# Patient Record
Sex: Female | Born: 1985 | Race: Black or African American | Hispanic: No | Marital: Single | Smoking: Never smoker
Health system: Southern US, Community
[De-identification: ages and names within clinical notes are randomized; demographics above are authoritative.]

## PROBLEM LIST (undated history)

## (undated) ENCOUNTER — Inpatient Hospital Stay (HOSPITAL_COMMUNITY): Payer: Self-pay

## (undated) DIAGNOSIS — O139 Gestational [pregnancy-induced] hypertension without significant proteinuria, unspecified trimester: Secondary | ICD-10-CM

## (undated) DIAGNOSIS — N83209 Unspecified ovarian cyst, unspecified side: Secondary | ICD-10-CM

## (undated) DIAGNOSIS — A749 Chlamydial infection, unspecified: Secondary | ICD-10-CM

## (undated) HISTORY — PX: INDUCED ABORTION: SHX677

## (undated) HISTORY — PX: NO PAST SURGERIES: SHX2092

## (undated) HISTORY — DX: Unspecified ovarian cyst, unspecified side: N83.209

---

## 2005-04-26 ENCOUNTER — Emergency Department (HOSPITAL_COMMUNITY): Admission: EM | Admit: 2005-04-26 | Discharge: 2005-04-26 | Payer: Self-pay | Admitting: Emergency Medicine

## 2006-03-14 ENCOUNTER — Emergency Department (HOSPITAL_COMMUNITY): Admission: EM | Admit: 2006-03-14 | Discharge: 2006-03-15 | Payer: Self-pay | Admitting: Emergency Medicine

## 2006-07-28 ENCOUNTER — Emergency Department (HOSPITAL_COMMUNITY): Admission: EM | Admit: 2006-07-28 | Discharge: 2006-07-28 | Payer: Self-pay | Admitting: Emergency Medicine

## 2007-03-06 ENCOUNTER — Emergency Department (HOSPITAL_COMMUNITY): Admission: EM | Admit: 2007-03-06 | Discharge: 2007-03-06 | Payer: Self-pay | Admitting: Emergency Medicine

## 2009-11-06 ENCOUNTER — Emergency Department (HOSPITAL_COMMUNITY): Admission: EM | Admit: 2009-11-06 | Discharge: 2009-11-06 | Payer: Self-pay | Admitting: Emergency Medicine

## 2011-02-27 LAB — URINALYSIS, ROUTINE W REFLEX MICROSCOPIC
Bilirubin Urine: NEGATIVE
Hgb urine dipstick: NEGATIVE
Ketones, ur: 15 — AB
Protein, ur: NEGATIVE
Specific Gravity, Urine: 1.027

## 2011-02-27 LAB — DIFFERENTIAL
Basophils Absolute: 0.4 — ABNORMAL HIGH
Basophils Relative: 5 — ABNORMAL HIGH
Eosinophils Absolute: 0.1
Eosinophils Relative: 1

## 2011-02-27 LAB — WET PREP, GENITAL
Clue Cells Wet Prep HPF POC: NONE SEEN
WBC, Wet Prep HPF POC: NONE SEEN

## 2011-02-27 LAB — URINE MICROSCOPIC-ADD ON

## 2011-02-27 LAB — CBC
MCHC: 31.5
MCV: 67.2 — ABNORMAL LOW
RDW: 15.2 — ABNORMAL HIGH
WBC: 7.2

## 2011-02-27 LAB — GC/CHLAMYDIA PROBE AMP, GENITAL: Chlamydia, DNA Probe: NEGATIVE

## 2012-07-07 ENCOUNTER — Emergency Department (HOSPITAL_COMMUNITY)
Admission: EM | Admit: 2012-07-07 | Discharge: 2012-07-07 | Disposition: A | Discharge status: Home / Self Care | Payer: BLUE CROSS/BLUE SHIELD | Attending: Emergency Medicine | Admitting: Emergency Medicine

## 2012-07-07 ENCOUNTER — Emergency Department (HOSPITAL_COMMUNITY): Payer: BLUE CROSS/BLUE SHIELD

## 2012-07-07 ENCOUNTER — Encounter (HOSPITAL_COMMUNITY): Payer: Self-pay | Admitting: *Deleted

## 2012-07-07 DIAGNOSIS — Z3202 Encounter for pregnancy test, result negative: Secondary | ICD-10-CM | POA: Insufficient documentation

## 2012-07-07 DIAGNOSIS — N390 Urinary tract infection, site not specified: Secondary | ICD-10-CM

## 2012-07-07 DIAGNOSIS — N83209 Unspecified ovarian cyst, unspecified side: Secondary | ICD-10-CM | POA: Insufficient documentation

## 2012-07-07 LAB — COMPREHENSIVE METABOLIC PANEL
AST: 15 U/L (ref 0–37)
Albumin: 4 g/dL (ref 3.5–5.2)
Calcium: 9.3 mg/dL (ref 8.4–10.5)
Chloride: 104 mEq/L (ref 96–112)
Creatinine, Ser: 0.76 mg/dL (ref 0.50–1.10)

## 2012-07-07 LAB — CBC WITH DIFFERENTIAL/PLATELET
Basophils Absolute: 0.1 10*3/uL (ref 0.0–0.1)
Eosinophils Relative: 1 % (ref 0–5)
MCH: 21.7 pg — ABNORMAL LOW (ref 26.0–34.0)
MCHC: 32.9 g/dL (ref 30.0–36.0)
Monocytes Relative: 7 % (ref 3–12)
Platelets: 201 10*3/uL (ref 150–400)
RDW: 15.4 % (ref 11.5–15.5)
WBC: 6.3 10*3/uL (ref 4.0–10.5)

## 2012-07-07 LAB — URINE MICROSCOPIC-ADD ON

## 2012-07-07 LAB — URINALYSIS, ROUTINE W REFLEX MICROSCOPIC
Bilirubin Urine: NEGATIVE
Glucose, UA: NEGATIVE mg/dL
Protein, ur: NEGATIVE mg/dL
Specific Gravity, Urine: 1.023 (ref 1.005–1.030)
Urobilinogen, UA: 1 mg/dL (ref 0.0–1.0)
pH: 6 (ref 5.0–8.0)

## 2012-07-07 MED ORDER — TRAMADOL HCL 50 MG PO TABS: 50.0000 mg | ORAL_TABLET | Freq: Four times a day (QID) | ORAL | Status: DC | PRN

## 2012-07-07 MED ORDER — CEPHALEXIN 500 MG PO CAPS: 500.0000 mg | ORAL_CAPSULE | Freq: Four times a day (QID) | ORAL | Status: DC

## 2012-07-07 NOTE — ED Provider Notes (Signed)
History     CSN: [id number]  Arrival date & time 07/07/12  1138   First MD Initiated Contact with Patient 07/07/12 1320      Chief Complaint  Patient presents with  . Abdominal Pain    (Consider location/radiation/quality/duration/timing/severity/associated sxs/prior treatment) Patient is a 27 y.o. female presenting with abdominal pain. The history is provided by the patient (the pt complains of lower abd pain). No language interpreter was used.  Abdominal Pain Pain location:  RLQ Pain quality: aching   Pain radiates to:  Does not radiate Pain severity:  Moderate Onset quality:  Gradual Timing:  Constant Progression:  Waxing and waning Chronicity:  New Context: not alcohol use   Associated symptoms: no chest pain, no cough, no diarrhea, no fatigue and no hematuria     History reviewed. No pertinent past medical history.  History reviewed. No pertinent past surgical history.  No family history on file.  History  Substance Use Topics  . Smoking status: Never Smoker   . Smokeless tobacco: Not on file  . Alcohol Use: Yes     Comment: mosly wine on weekends    OB History   Grav Para Term Preterm Abortions TAB SAB Ect Mult Living                  Review of Systems  Constitutional: Negative for fatigue.  HENT: Negative for congestion, sinus pressure and ear discharge.   Eyes: Negative for discharge.  Respiratory: Negative for cough.   Cardiovascular: Negative for chest pain.  Gastrointestinal: Positive for abdominal pain. Negative for diarrhea.  Genitourinary: Negative for frequency and hematuria.  Musculoskeletal: Negative for back pain.  Skin: Negative for rash.  Neurological: Negative for seizures and headaches.  Psychiatric/Behavioral: Negative for hallucinations.    Allergies  Review of patient's allergies indicates no known allergies.  Home Medications   Current Outpatient Rx  Name  Route  Sig  Dispense  Refill  . ibuprofen (ADVIL,MOTRIN) 200 MG  tablet   Oral   Take 400-800 mg by mouth every 6 (six) hours as needed for pain.         . cephALEXin (KEFLEX) 500 MG capsule   Oral   Take 1 capsule (500 mg total) by mouth 4 (four) times daily.   28 capsule   0   . traMADol (ULTRAM) 50 MG tablet   Oral   Take 1 tablet (50 mg total) by mouth every 6 (six) hours as needed for pain.   30 tablet   0     BP 100/68  Pulse 74  Temp(Src) 99.8 F (37.7 C) (Oral)  Resp 14  SpO2 100%  LMP 06/09/2012  Physical Exam  Constitutional: She is oriented to person, place, and time. She appears well-developed.  HENT:  Head: Normocephalic and atraumatic.  Eyes: Conjunctivae and EOM are normal. No scleral icterus.  Neck: Neck supple. No thyromegaly present.  Cardiovascular: Normal rate and regular rhythm.  Exam reveals no gallop and no friction rub.   No murmur heard. Pulmonary/Chest: No stridor. She has no wheezes. She has no rales. She exhibits no tenderness.  Abdominal: She exhibits no distension. There is tenderness. There is no rebound.  Tender rlq  Musculoskeletal: Normal range of motion. She exhibits no edema.  Lymphadenopathy:    She has no cervical adenopathy.  Neurological: She is oriented to person, place, and time. Coordination normal.  Skin: No rash noted. No erythema.  Psychiatric: She has a normal mood and affect. Her behavior  is normal.    ED Course  Procedures (including critical care time)  Labs Reviewed  URINALYSIS, ROUTINE W REFLEX MICROSCOPIC - Abnormal; Notable for the following:    APPearance HAZY (*)    Leukocytes, UA SMALL (*)    All other components within normal limits  CBC WITH DIFFERENTIAL - Abnormal; Notable for the following:    RBC 5.40 (*)    Hemoglobin 11.7 (*)    HCT 35.6 (*)    MCV 65.9 (*)    MCH 21.7 (*)    All other components within normal limits  COMPREHENSIVE METABOLIC PANEL - Abnormal; Notable for the following:    Alkaline Phosphatase 33 (*)    All other components within normal  limits  URINE MICROSCOPIC-ADD ON - Abnormal; Notable for the following:    Squamous Epithelial / LPF FEW (*)    Bacteria, UA MANY (*)    All other components within normal limits  URINE CULTURE  GC/CHLAMYDIA PROBE AMP  POCT PREGNANCY, URINE   US Transvaginal Non-ob  07/07/2012  *RADIOLOGY REPORT*  Clinical Data: Pelvic pain.  TRANSABDOMINAL AND TRANSVAGINAL ULTRASOUND OF PELVIS Technique:  Both transabdominal and transvaginal ultrasound examinations of the pelvis were performed. Transabdominal technique was performed for global imaging of the pelvis including uterus, ovaries, adnexal regions, and pelvic cul-de-sac.  It was necessary to proceed with endovaginal exam following the transabdominal exam to visualize the ovaries and endometrium.  Comparison:  None  Findings:  Uterus: Measures 7.5 x 5.0 x 5.3 cm.  No myometrial abnormalities are identified.  Endometrium: Normal in thickness measuring a maximum of 7.5 mm.  Right ovary:  Measures 3.0 x 2.2 x 2.2 cm.  No cysts or masses.  Left ovary: Measures 6.1 x 2.8 x 2.4 cm.  There is a complex 2.4 x 2.7 x 1.9 cm cyst.  It demonstrate septations and layering debris.  Other findings: Trace amount of free pelvic fluid  IMPRESSION:  1.  Normal sonographic appearance of the uterus and right ovary. 2.  Complex cyst associated with the left ovary.  A follow-up ultrasound examination in 4 months is suggested.   Original Report Authenticated By: Rudie Meyer, M.D.    US Pelvis Complete  07/07/2012  *RADIOLOGY REPORT*  Clinical Data: Pelvic pain.  TRANSABDOMINAL AND TRANSVAGINAL ULTRASOUND OF PELVIS Technique:  Both transabdominal and transvaginal ultrasound examinations of the pelvis were performed. Transabdominal technique was performed for global imaging of the pelvis including uterus, ovaries, adnexal regions, and pelvic cul-de-sac.  It was necessary to proceed with endovaginal exam following the transabdominal exam to visualize the ovaries and endometrium.   Comparison:  None  Findings:  Uterus: Measures 7.5 x 5.0 x 5.3 cm.  No myometrial abnormalities are identified.  Endometrium: Normal in thickness measuring a maximum of 7.5 mm.  Right ovary:  Measures 3.0 x 2.2 x 2.2 cm.  No cysts or masses.  Left ovary: Measures 6.1 x 2.8 x 2.4 cm.  There is a complex 2.4 x 2.7 x 1.9 cm cyst.  It demonstrate septations and layering debris.  Other findings: Trace amount of free pelvic fluid  IMPRESSION:  1.  Normal sonographic appearance of the uterus and right ovary. 2.  Complex cyst associated with the left ovary.  A follow-up ultrasound examination in 4 months is suggested.   Original Report Authenticated By: Rudie Meyer, M.D.      1. UTI (lower urinary tract infection)   2. Ovarian cyst       MDM  Benny Lennert, MD 07/07/12 [phone]

## 2012-07-07 NOTE — ED Notes (Signed)
Pt to ED c/o RLQ pain x 1 year that became much worse yesterday.  Pt does c/o of some "mildy malodorous" discharge x 1 year that increased yesterday when the pain increased. Denies changes in bowel or bladder habits.  PT still has appendix.

## 2012-07-08 LAB — URINE CULTURE: Colony Count: 50000

## 2012-07-09 LAB — GC/CHLAMYDIA PROBE AMP: CT Probe RNA: POSITIVE — AB

## 2012-07-10 NOTE — ED Notes (Signed)
+  Chlamydia Chart sent to EDP office for review.  

## 2012-07-14 ENCOUNTER — Telehealth (HOSPITAL_COMMUNITY): Payer: Self-pay | Admitting: Emergency Medicine

## 2012-07-14 NOTE — ED Notes (Signed)
Chart reviewed by Dr Effie Shy "call pt tell (+) chlamydia, this is an STD .  Have partner checked & treated No sex for 2 weeks Call in "Zithromax 1 gram po x 1 dose""  Pt called, ID verified x 2.  Pt informed of dx, need for addl treatment, notify partner(s) for testing and tx and abstain from sex x2 wks.  Rx called to [facility] @ [phone].  [employer] form completed and faxed.

## 2012-07-23 ENCOUNTER — Ambulatory Visit (INDEPENDENT_AMBULATORY_CARE_PROVIDER_SITE_OTHER): Payer: BC Managed Care – PPO | Admitting: Obstetrics & Gynecology

## 2012-07-23 ENCOUNTER — Encounter: Payer: Self-pay | Admitting: Obstetrics & Gynecology

## 2012-07-23 ENCOUNTER — Other Ambulatory Visit (HOSPITAL_COMMUNITY)
Admission: RE | Admit: 2012-07-23 | Discharge: 2012-07-23 | Disposition: A | Discharge status: Home / Self Care | Payer: BC Managed Care – PPO | Source: Ambulatory Visit | Attending: Obstetrics & Gynecology | Admitting: Obstetrics & Gynecology

## 2012-07-23 VITALS — BP 142/89 | HR 87 | Ht 65.0 in | Wt 188.8 lb

## 2012-07-23 DIAGNOSIS — N83209 Unspecified ovarian cyst, unspecified side: Secondary | ICD-10-CM | POA: Insufficient documentation

## 2012-07-23 DIAGNOSIS — N760 Acute vaginitis: Secondary | ICD-10-CM | POA: Insufficient documentation

## 2012-07-23 DIAGNOSIS — A562 Chlamydial infection of genitourinary tract, unspecified: Secondary | ICD-10-CM

## 2012-07-23 DIAGNOSIS — Z3009 Encounter for other general counseling and advice on contraception: Secondary | ICD-10-CM

## 2012-07-23 MED ORDER — NORGESTIM-ETH ESTRAD TRIPHASIC 0.18/0.215/0.25 MG-35 MCG PO TABS: 1.0000 | ORAL_TABLET | Freq: Every day | ORAL | Status: DC

## 2012-07-23 NOTE — Patient Instructions (Signed)
Oral Contraception Use Oral contraceptives (OCs) are medicines taken to prevent pregnancy. OCs work by preventing the ovaries from releasing eggs. The hormones in OCs also cause the cervical mucus to thicken, preventing the sperm from entering the uterus. The hormones also cause the uterine lining to become thin, not allowing a fertilized egg to attach to the inside of the uterus. OCs are highly effective when taken exactly as prescribed. However, OCs do not prevent sexually transmitted diseases (STDs). Safe sex practices, such as using condoms along with an OC, can help prevent STDs.  Before taking OCs, you may have a physical exam and Pap test. Your caregiver may also order blood tests if necessary. Your caregiver will make sure you are a good candidate for oral contraception. Discuss with your caregiver the possible side effects of the OC you may be prescribed. When starting an OC, it can take 2 to 3 months for the body to adjust to the changes in hormone levels in your body.  HOW TO TAKE ORAL CONTRACEPTIVES Your caregiver may advise you on how to start taking the first cycle of OCs. Otherwise, you can:  Start on day 1 of your menstrual period. You will not need any backup contraceptive protection with this start time.  Start on the first Sunday after your menstrual period or the day you get your prescription. In these cases, you will need to use backup contraceptive protection for the first 7-day cycle. After you have started taking OCs:  If you forget to take 1 pill, take it as soon as you remember. Take the next pill at the regular time.  If you miss 2 or more pills, use backup birth control until your next menstrual period starts.  If you use a 28-day pack that contains inactive pills and you miss 1 of the last 7 pills (pills with no hormones), it will not matter. Throw away the rest of the non-hormone pills and start a new pill pack. No matter which day you start the OC, you will always start  a new pack on that same day of the week. Have an extra pack of OCs and a backup contraceptive method available in case you miss some pills or lose your OC pack. HOME CARE INSTRUCTIONS   Do not smoke.  Always use a condom to protect against STDs. OCs do not protect against STDs.  Use a calendar to mark your menstrual period days.  Read the information and directions that come with your OC. Talk to your caregiver if you have questions. SEEK MEDICAL CARE IF:   You develop nausea and vomiting.  You have abnormal vaginal discharge or bleeding.  You develop a rash.  You miss your menstrual period.  You are losing your hair.  You need treatment for mood swings or depression.  You get dizzy when taking the OC.  You develop acne from taking the OC.  You become pregnant. SEEK IMMEDIATE MEDICAL CARE IF:   You develop chest pain.  You develop shortness of breath.  You have an uncontrolled or severe headache.  You develop numbness or slurred speech.  You develop visual problems.  You develop pain, redness, and swelling in the legs. Document Released: 04/25/2011 Document Revised: 07/29/2011 Document Reviewed: 04/25/2011 Exit[employer] Patient Information 2013 [employer].  

## 2012-07-23 NOTE — Progress Notes (Signed)
  Subjective:    Patient ID: Bridget Summers, female    DOB: 02-06-1986, 27 y.o.   MRN: [id number]  HPI Patient's last menstrual period was 07/13/2012. G1P1 Pt is a 27 y/o female who presents today for follow-up of ovarian cyst that was found at presentation to the ED for RLQ abdominal and flank pain, as well as follow-up for GU infection that was incidentally found on that ED visit. She is also requesting birth control today. She has not been seen for routine gyn care in at least 4 years since her daughter was born as she has been without insurance.   Ovarian Cyst She reports that her symptoms had initially improved, however, since yesterday she is experiencing R-sided pain again and has had to resume taking tramadol today. She has not experienced any abnormal vaginal bleeding. Her LMP was 07/13/12.   GU infection She reports completing the Keflex that was prescribed to her by the ED. She denies any vaginal discharge or other symptoms. Her reports that her partner received treatment as well.   Contraception She has taken OCPs in the past and believes they work well for her. She is ok to restart those. She has been off of birth control since her daughter was born, about 4 years ago. She does not smoke or have a history of blood clots.   Gynecological history Bleeding: regular Cycle: regular Contraception: none  Review of Systems  Genitourinary: Positive for flank pain. Negative for vaginal bleeding, vaginal discharge and menstrual problem.      Objective:   Physical Exam  Constitutional: She appears well-developed and well-nourished. No distress.  Neck: Neck supple.  Pulmonary/Chest: Effort normal. No respiratory distress.  Genitourinary: There is no rash, tenderness or lesion on the right labia. There is no rash, tenderness or lesion on the left labia. Cervix exhibits discharge (malodorous, thin, white). Cervix exhibits no motion tenderness and no friability. Right adnexum displays no  mass and no tenderness. Left adnexum displays no mass and no tenderness.      Assessment & Plan:  1. Ovarian cyst - luteal cyst is most likely with this patient. Schedule follow-up ultrasound. If it gets worse, RTC or go to the ED.   2. Chlamydia - labs from ED visit show positive for Chlamydia but was called a UTI by the ED and treated as such with Keflex, which is not the recommended treatment for chlamydia. Pt still showing signs of chlamydia in the form of malodorous discharge. Specimen sent to lab; Azithromycin 1 g PO x1 dose if positive. Wet prep sent to lab.  3. Contraception - prescription sent for OCPs. Pt counseled that it should be ok to start immediately as she reports not having engaged in intercourse lately. Advised to wait at least 1 week before engaging in intercourse to ensure efficacy. Tri Sprintec  4. Health maintenance - PAP screen performed today - Recommended routine GYN care, especially now that pt has insurance coverage - Counseled that OCPs will not protect against STDs   Lorna Dibble, PA-S 07/23/12

## 2012-08-25 ENCOUNTER — Ambulatory Visit (HOSPITAL_COMMUNITY): Payer: Self-pay | Attending: Obstetrics & Gynecology

## 2012-08-27 ENCOUNTER — Emergency Department (HOSPITAL_COMMUNITY): Payer: Self-pay

## 2012-08-27 ENCOUNTER — Encounter (HOSPITAL_COMMUNITY): Payer: Self-pay | Admitting: Emergency Medicine

## 2012-08-27 ENCOUNTER — Emergency Department (HOSPITAL_COMMUNITY)
Admission: EM | Admit: 2012-08-27 | Discharge: 2012-08-27 | Disposition: A | Discharge status: Home / Self Care | Payer: Self-pay | Attending: Emergency Medicine | Admitting: Emergency Medicine

## 2012-08-27 DIAGNOSIS — N760 Acute vaginitis: Secondary | ICD-10-CM | POA: Insufficient documentation

## 2012-08-27 DIAGNOSIS — O9989 Other specified diseases and conditions complicating pregnancy, childbirth and the puerperium: Secondary | ICD-10-CM | POA: Insufficient documentation

## 2012-08-27 DIAGNOSIS — O2390 Unspecified genitourinary tract infection in pregnancy, unspecified trimester: Secondary | ICD-10-CM | POA: Insufficient documentation

## 2012-08-27 DIAGNOSIS — O209 Hemorrhage in early pregnancy, unspecified: Secondary | ICD-10-CM | POA: Insufficient documentation

## 2012-08-27 DIAGNOSIS — R1031 Right lower quadrant pain: Secondary | ICD-10-CM | POA: Insufficient documentation

## 2012-08-27 DIAGNOSIS — Z79899 Other long term (current) drug therapy: Secondary | ICD-10-CM | POA: Insufficient documentation

## 2012-08-27 DIAGNOSIS — Z8742 Personal history of other diseases of the female genital tract: Secondary | ICD-10-CM | POA: Insufficient documentation

## 2012-08-27 DIAGNOSIS — O26899 Other specified pregnancy related conditions, unspecified trimester: Secondary | ICD-10-CM

## 2012-08-27 DIAGNOSIS — B9689 Other specified bacterial agents as the cause of diseases classified elsewhere: Secondary | ICD-10-CM | POA: Insufficient documentation

## 2012-08-27 DIAGNOSIS — A499 Bacterial infection, unspecified: Secondary | ICD-10-CM | POA: Insufficient documentation

## 2012-08-27 LAB — COMPREHENSIVE METABOLIC PANEL
ALT: 7 U/L (ref 0–35)
Alkaline Phosphatase: 25 U/L — ABNORMAL LOW (ref 39–117)
CO2: 25 mEq/L (ref 19–32)
Calcium: 9.2 mg/dL (ref 8.4–10.5)
GFR calc Af Amer: >90 mL/min (ref >90)
GFR calc non Af Amer: >90 mL/min (ref >90)
Glucose, Bld: 84 mg/dL (ref 70–99)
Potassium: 3.3 mEq/L — ABNORMAL LOW (ref 3.5–5.1)
Sodium: 134 mEq/L — ABNORMAL LOW (ref 135–145)
Total Bilirubin: 0.4 mg/dL (ref 0.3–1.2)

## 2012-08-27 LAB — URINALYSIS, MICROSCOPIC ONLY
Bilirubin Urine: NEGATIVE
Nitrite: NEGATIVE
Protein, ur: NEGATIVE mg/dL
Specific Gravity, Urine: 1.027 (ref 1.005–1.030)
Urobilinogen, UA: 1 mg/dL (ref 0.0–1.0)

## 2012-08-27 LAB — CBC WITH DIFFERENTIAL/PLATELET
Basophils Absolute: 0.1 10*3/uL (ref 0.0–0.1)
Eosinophils Absolute: 0.1 10*3/uL (ref 0.0–0.7)
Lymphocytes Relative: 30 % (ref 12–46)
MCHC: 34.1 g/dL (ref 30.0–36.0)
Neutrophils Relative %: 62 % (ref 43–77)
RDW: 15.1 % (ref 11.5–15.5)

## 2012-08-27 LAB — WET PREP, GENITAL

## 2012-08-27 MED ORDER — AZITHROMYCIN 250 MG PO TABS
1000.0000 mg | ORAL_TABLET | Freq: Once | ORAL | Status: AC
  Administered 2012-08-27: 1000 mg via ORAL
  Filled 2012-08-27: qty 4

## 2012-08-27 MED ORDER — PRENATAL COMPLETE 14-0.4 MG PO TABS: 1.0000 | ORAL_TABLET | Freq: Every day | ORAL | Status: DC

## 2012-08-27 MED ORDER — CEFTRIAXONE SODIUM 250 MG IJ SOLR
250.0000 mg | Freq: Once | INTRAMUSCULAR | Status: AC
  Administered 2012-08-27: 250 mg via INTRAMUSCULAR
  Filled 2012-08-27: qty 250

## 2012-08-27 MED ORDER — METRONIDAZOLE 0.75 % VA GEL: 1.0000 | Freq: Two times a day (BID) | VAGINAL | Status: DC

## 2012-08-27 NOTE — ED Notes (Signed)
Pt c/o severe right sided pain starting last night; pt sts positive pregnancy test and LMP was 12/27; pt G2 P1; pt sts mild vaginal spotting

## 2012-08-27 NOTE — ED Provider Notes (Signed)
History     CSN: [id number]  Arrival date & time 08/27/12  1150   First MD Initiated Contact with Patient 08/27/12 1201      Chief Complaint  Patient presents with  . Abdominal Pain    (Consider location/radiation/quality/duration/timing/severity/associated sxs/prior treatment) HPI Comments: Patient is a 27 year old G2P1 female who presents with a 2 day history of abdominal pain. The pain is located in her central and right lower abdomen and does not radiate. The pain is described as aching and severe. The pain started gradually and progressively worsened since the onset. No alleviating/aggravating factors. The patient has tried nothing for symptoms without relief. Associated symptoms include light vaginal bleeding. Patient denies fever, headache, NVD, chest pain, SOB, dysuria, constipation. Patient's LMP 07/16/2012.       Past Medical History  Diagnosis Date  . Ovarian cyst     History reviewed. No pertinent past surgical history.  Family History  Problem Relation Age of Onset  . Diabetes Paternal Grandfather     History  Substance Use Topics  . Smoking status: Never Smoker   . Smokeless tobacco: Never Used  . Alcohol Use: Yes     Comment: mosly wine on weekends    OB History   Grav Para Term Preterm Abortions TAB SAB Ect Mult Living                  Review of Systems  Gastrointestinal: Positive for abdominal pain.  Genitourinary: Positive for vaginal bleeding.  All other systems reviewed and are negative.    Allergies  Review of patient's allergies indicates no known allergies.  Home Medications   Current Outpatient Rx  Name  Route  Sig  Dispense  Refill  . cephALEXin (KEFLEX) 500 MG capsule   Oral   Take 1 capsule (500 mg total) by mouth 4 (four) times daily.   28 capsule   0   . ibuprofen (ADVIL,MOTRIN) 200 MG tablet   Oral   Take 400-800 mg by mouth every 6 (six) hours as needed for pain.         . Norgestimate-Ethinyl Estradiol Triphasic  0.18/0.215/0.25 MG-35 MCG tablet   Oral   Take 1 tablet by mouth daily.   1 Package   11   . traMADol (ULTRAM) 50 MG tablet   Oral   Take 1 tablet (50 mg total) by mouth every 6 (six) hours as needed for pain.   30 tablet   0     BP 119/69  Pulse 94  Temp(Src) 98.5 F (36.9 C) (Oral)  Resp 18  SpO2 99%  Physical Exam  Nursing note and vitals reviewed. Constitutional: She is oriented to person, place, and time. She appears well-developed and well-nourished. No distress.  HENT:  Head: Normocephalic and atraumatic.  Eyes: Conjunctivae and EOM are normal.  Neck: Normal range of motion.  Cardiovascular: Normal rate and regular rhythm.  Exam reveals no gallop and no friction rub.   No murmur heard. Pulmonary/Chest: Effort normal and breath sounds normal. She has no wheezes. She has no rales. She exhibits no tenderness.  Abdominal: Soft. She exhibits no distension. There is tenderness. There is no rebound and no guarding.  Right and central lower abdominal pain.   Musculoskeletal: Normal range of motion.  Neurological: She is alert and oriented to person, place, and time. Coordination normal.  Speech is goal-oriented. Moves limbs without ataxia.   Skin: Skin is warm and dry.  Psychiatric: She has a normal mood and  affect. Her behavior is normal.    ED Course  Procedures (including critical care time)  Labs Reviewed  WET PREP, GENITAL - Abnormal; Notable for the following:    Clue Cells Wet Prep HPF POC FEW (*)    WBC, Wet Prep HPF POC FEW (*)    All other components within normal limits  COMPREHENSIVE METABOLIC PANEL - Abnormal; Notable for the following:    Sodium 134 (*)    Potassium 3.3 (*)    Alkaline Phosphatase 25 (*)    All other components within normal limits  CBC WITH DIFFERENTIAL - Abnormal; Notable for the following:    Hemoglobin 10.8 (*)    HCT 31.7 (*)    MCV 63.1 (*)    MCH 21.5 (*)    All other components within normal limits  URINALYSIS,  MICROSCOPIC ONLY - Abnormal; Notable for the following:    APPearance HAZY (*)    Leukocytes, UA SMALL (*)    Squamous Epithelial / LPF FEW (*)    All other components within normal limits  HCG, QUANTITATIVE, PREGNANCY - Abnormal; Notable for the following:    hCG, Beta Chain, Quant, S [address] (*)    All other components within normal limits  GC/CHLAMYDIA PROBE AMP   US Ob Comp Less 14 Wks  08/27/2012  *RADIOLOGY REPORT*  Clinical Data: Right lower abdominal pain, pregnant  OBSTETRIC <14 WK [place] AND TRANSVAGINAL OB US  Technique:  Both transabdominal and transvaginal ultrasound examinations were performed for complete evaluation of the gestation as well as the maternal uterus, adnexal regions, and pelvic cul-de-sac.  Transvaginal technique was performed to assess early pregnancy.  Comparison:  None.  Intrauterine gestational sac:  Visualized/normal in shape. Yolk sac: Present Embryo: Present Cardiac Activity: Present Heart Rate: 136 bpm  CRL: 8.8  mm  6 w  6 d        [place] EDC: 04/16/2013  Maternal uterus/adnexae: No subchronic hemorrhage.  Right ovary measures 3.7 x 4.2 x 4.8 cm and is notable for a 3.1 x 3.2 x 3.7 cm cyst/follicle.  Left ovary is normal in size and appearance, measuring 2.5 x 2.5 x 4.1 cm.  Moderate free fluid in the right adnexa and pelvic cul-de-sac.  IMPRESSION: Single live intrauterine gestation with estimated gestational age [redacted] weeks 6 days by crown-rump length.  3.7 cm right ovarian cyst / follicle, likely physiologic.   Original Report Authenticated By: Charline Bills, M.D.    US Ob Transvaginal  08/27/2012  *RADIOLOGY REPORT*  Clinical Data: Right lower abdominal pain, pregnant  OBSTETRIC <14 WK [place] AND TRANSVAGINAL OB US  Technique:  Both transabdominal and transvaginal ultrasound examinations were performed for complete evaluation of the gestation as well as the maternal uterus, adnexal regions, and pelvic cul-de-sac.  Transvaginal technique was performed to assess early  pregnancy.  Comparison:  None.  Intrauterine gestational sac:  Visualized/normal in shape. Yolk sac: Present Embryo: Present Cardiac Activity: Present Heart Rate: 136 bpm  CRL: 8.8  mm  6 w  6 d        [place] EDC: 04/16/2013  Maternal uterus/adnexae: No subchronic hemorrhage.  Right ovary measures 3.7 x 4.2 x 4.8 cm and is notable for a 3.1 x 3.2 x 3.7 cm cyst/follicle.  Left ovary is normal in size and appearance, measuring 2.5 x 2.5 x 4.1 cm.  Moderate free fluid in the right adnexa and pelvic cul-de-sac.  IMPRESSION: Single live intrauterine gestation with estimated gestational age [redacted] weeks 6 days by crown-rump  length.  3.7 cm right ovarian cyst / follicle, likely physiologic.   Original Report Authenticated By: Charline Bills, M.D.      1. Abdominal pain in pregnancy   2. BV (bacterial vaginosis)       MDM  12:03 PM  Labs pending. Pelvic done.   3:18 PM Labs unremarkable. US shows live IUP. Patient will be treated for GC chlamydia here and treated for BV with metrogel. Patient will be discharged with prenatal vitamins and instructions to follow up with [facility] for further evaluation and management. Vitals stable and patient afebrile. Patient instructed to go to [facility] for worsening or concerning symptoms.         Emilia Beck, PA-C 08/27/12 1526

## 2012-08-27 NOTE — ED Notes (Addendum)
Pt states she has pain on right sided upper and lower abd quadrants. New onset of "extreme pain" last night "10/10". Hx of L ovarian cyst. Pt reports pain when lying. Pt reports finding out she was pregnant [redacted] week ago. Does not have an OBGYN, planned on making an appointment this week before pain began. Pt reports LMP was "end of February"

## 2012-08-29 NOTE — ED Provider Notes (Signed)
Medical screening examination/treatment/procedure(s) were performed by non-physician practitioner and as supervising physician I was immediately available for consultation/collaboration.   Laray Anger, DO 08/29/12 1138

## 2013-06-30 ENCOUNTER — Emergency Department (HOSPITAL_COMMUNITY)
Admission: EM | Admit: 2013-06-30 | Discharge: 2013-06-30 | Disposition: A | Discharge status: Home / Self Care | Payer: Self-pay | Attending: Emergency Medicine | Admitting: Emergency Medicine

## 2013-06-30 ENCOUNTER — Encounter (HOSPITAL_COMMUNITY): Payer: Self-pay | Admitting: Emergency Medicine

## 2013-06-30 DIAGNOSIS — H9202 Otalgia, left ear: Secondary | ICD-10-CM

## 2013-06-30 DIAGNOSIS — R6884 Jaw pain: Secondary | ICD-10-CM | POA: Insufficient documentation

## 2013-06-30 DIAGNOSIS — H9209 Otalgia, unspecified ear: Secondary | ICD-10-CM | POA: Insufficient documentation

## 2013-06-30 DIAGNOSIS — J029 Acute pharyngitis, unspecified: Secondary | ICD-10-CM | POA: Insufficient documentation

## 2013-06-30 MED ORDER — OXYMETAZOLINE HCL 0.05 % NA SOLN
1.0000 | Freq: Once | NASAL | Status: AC
  Administered 2013-06-30: 1 via NASAL
  Filled 2013-06-30: qty 15

## 2013-06-30 NOTE — Discharge Instructions (Signed)
1. Medications: afrin, usual home medications 2. Treatment: rest, drink plenty of fluids,  3. Follow Up: Please followup with your primary doctor for discussion of your diagnoses and further evaluation after today's visit; if you do not have a primary care doctor use the resource guide provided to find one;    Otalgia The most common reason for this in children is an infection of the middle ear. Pain from the middle ear is usually caused by a build-up of fluid and pressure behind the eardrum. Pain from an earache can be sharp, dull, or burning. The pain may be temporary or constant. The middle ear is connected to the nasal passages by a short narrow tube called the Eustachian tube. The Eustachian tube allows fluid to drain out of the middle ear, and helps keep the pressure in your ear equalized. CAUSES  A cold or allergy can block the Eustachian tube with inflammation and the build-up of secretions. This is especially likely in small children, because their Eustachian tube is shorter and more horizontal. When the Eustachian tube closes, the normal flow of fluid from the middle ear is stopped. Fluid can accumulate and cause stuffiness, pain, hearing loss, and an ear infection if germs start growing in this area. SYMPTOMS  The symptoms of an ear infection may include fever, ear pain, fussiness, increased crying, and irritability. Many children will have temporary and minor hearing loss during and right after an ear infection. Permanent hearing loss is rare, but the risk increases the more infections a child has. Other causes of ear pain include retained water in the outer ear canal from swimming and bathing. Ear pain in adults is less likely to be from an ear infection. Ear pain may be referred from other locations. Referred pain may be from the joint between your jaw and the skull. It may also come from a tooth problem or problems in the neck. Other causes of ear pain include:  A foreign body in the  ear.  Outer ear infection.  Sinus infections.  Impacted ear wax.  Ear injury.  Arthritis of the jaw or TMJ problems.  Middle ear infection.  Tooth infections.  Sore throat with pain to the ears. DIAGNOSIS  Your caregiver can usually make the diagnosis by examining you. Sometimes other special studies, including x-rays and lab work may be necessary. TREATMENT   If antibiotics were prescribed, use them as directed and finish them even if you or your child's symptoms seem to be improved.  Sometimes PE tubes are needed in children. These are little plastic tubes which are put into the eardrum during a simple surgical procedure. They allow fluid to drain easier and allow the pressure in the middle ear to equalize. This helps relieve the ear pain caused by pressure changes. HOME CARE INSTRUCTIONS   Only take over-the-counter or prescription medicines for pain, discomfort, or fever as directed by your caregiver. DO NOT GIVE CHILDREN ASPIRIN because of the association of Reye's Syndrome in children taking aspirin.  Use a cold pack applied to the outer ear for 15-20 minutes, 03-04 times per day or as needed may reduce pain. Do not apply ice directly to the skin. You may cause frost bite.  Over-the-counter ear drops used as directed may be effective. Your caregiver may sometimes prescribe ear drops.  Resting in an upright position may help reduce pressure in the middle ear and relieve pain.  Ear pain caused by rapidly descending from high altitudes can be relieved by swallowing or  chewing gum. Allowing infants to suck on a bottle during airplane travel can help.  Do not smoke in the house or near children. If you are unable to quit smoking, smoke outside.  Control allergies. SEEK IMMEDIATE MEDICAL CARE IF:   You or your child are becoming sicker.  Pain or fever relief is not obtained with medicine.  You or your child's symptoms (pain, fever, or irritability) do not improve within  24 to 48 hours or as instructed.  Severe pain suddenly stops hurting. This may indicate a ruptured eardrum.  You or your children develop new problems such as severe headaches, stiff neck, difficulty swallowing, or swelling of the face or around the ear. Document Released: 12/22/2003 Document Revised: 07/29/2011 Document Reviewed: 04/27/2008 [facility] Patient Information 2014 [place], [place].   Emergency Department Resource Guide 1) Find a Doctor and Pay Out of Pocket Although you won't have to find out who is covered by your insurance plan, it is a good idea to ask around and get recommendations. You will then need to call the office and see if the doctor you have chosen will accept you as a new patient and what types of options they offer for patients who are self-pay. Some doctors offer discounts or will set up payment plans for their patients who do not have insurance, but you will need to ask so you aren't surprised when you get to your appointment.  2) Contact Your Local Health Department Not all health departments have doctors that can see patients for sick visits, but many do, so it is worth a call to see if yours does. If you don't know where your local health department is, you can check in your phone book. The CDC also has a tool to help you locate your state's health department, and many state websites also have listings of all of their local health departments.  3) Find a Walk-in Clinic If your illness is not likely to be very severe or complicated, you may want to try a walk in clinic. These are popping up all over the country in pharmacies, drugstores, and shopping centers. They're usually staffed by nurse practitioners or physician assistants that have been trained to treat common illnesses and complaints. They're usually fairly quick and inexpensive. However, if you have serious medical issues or chronic medical problems, these are probably not your best option.  No Primary Care  Doctor: - Call Health Connect at  [phone] - they can help you locate a primary care doctor that  accepts your insurance, provides certain services, etc. - Physician Referral Service- [phone]  Chronic Pain Problems: Organization         Address  Phone   Notes  [facility]  [phone] Patients need to be referred by their primary care doctor.   Medication Assistance: Organization         Address  Phone   Notes  [facility] Medication [facility] [address] [place], [place] [phone] --Must be a resident of [facility] [place] -- Must have NO insurance coverage whatsoever (no Medicaid/ Medicare, etc.) -- The pt. MUST have a primary care doctor that directs their care regularly and follows them in the community   MedAssist  [phone]   [employer]  [phone]    Agencies that provide inexpensive medical care: Organization         Address  Phone   Notes  [facility]  [phone]  [facility]    [phone]   [facility] [place], [place] [address] [phone]   [place]. [address], [place] [phone]   Planned Parenthood    [phone]   [facility]    [phone]   [place] and [place], [place] Phone:  [phone], Fax:  [phone] Hours of Operation:  9 am - 6 pm, M-F.  Also accepts Medicaid/Medicare and self-pay.  [facility] for [place] [place], [address], [place] Phone: [phone], Fax: [phone]. Hours of Operation:  8:30 am - 5:30 pm, M-F.  Also accepts Medicaid and self-pay.  [facility] [place] [address] [place] Phone: [phone]   [place], [place], [place] [phone], Ext. [phone] Mondays & Thursdays: 7-9 AM.  First 15 patients are seen on a first  come, first serve basis.    [place] Providers:  Organization         Address  Phone   Notes  [facility] [address], [place] [phone] Also accepts self-pay patients.  [facility] [address], [place]  [phone]   [place], [address], [place] [phone]   [facility] [address], [place] [phone]   Lucianne Lei [address], [place]   [phone] Only accepts [place] Access [place] patients after they have their name applied to their card.   Self-Pay (no insurance) in [facility]:  Organization         Address  Phone   Notes  Sickle Cell Patients, [facility] [place] [phone]   [facility] [place] [phone]   [facility] [place]  [place], [place], [place] [phone]   [facility]/Dr. Osei-Bonsu  [address], [place] or [address], [place] [phone] Phone number for both [place] and [place] locations is the same.  [facility]gent [facility] [address] [place] [phone]   [facility] [address] [place] or [address] [phone]   [facility] [place] [address] [place] [phone], phone; [phone], fax Sees patients 1st and 3rd Saturday of every month.  Must not qualify for public or private insurance (i.e. Medicaid, Medicare,  Health Choice, Veterans' Benefits)  Household income should be no more than 200% of the poverty level The clinic cannot treat you if you are pregnant or think you are pregnant  Sexually transmitted diseases are not treated at the clinic.    Dental Care: Organization          Address  Phone  Notes  [facility] of [facility] [place] [phone] Accepts children up to age 37 who are enrolled in [place] or [place]; pregnant women with a Medicaid card; and children who have applied for Medicaid or  Health Choice, but were declined, whose parents can pay a reduced fee at time of service.  [facility] Department of [facility]  [address],  [place] [phone] Accepts children up to age 97 who are enrolled in Medicaid or [place] Health Choice; pregnant women with a Medicaid card; and children who have applied for Medicaid or [place] Health Choice, but were declined, whose parents can pay a reduced fee at time of service.  [facility] Access PROGRAM  [place] [phone] Patients are seen by appointment only. Walk-ins are not accepted. [place] will see patients 6 years of age and older. Monday - Tuesday (8am-5pm) Most Wednesdays (8:30-5pm) $30 per visit, cash only  [facility] Adult Dental Access PROGRAM  [address], [facility] [phone] Patients are seen by appointment only. Walk-ins are not accepted. [place] will see patients 70 years of age and older. One Wednesday Evening (Monthly: Volunteer Based).  $30 per visit, cash only  [place]  [phone] for adults; Children under age 35, call Graduate Pediatric Dentistry at [phone]. Children aged 36-14, please call [phone] to request a pediatric application.  Dental services are provided in all areas of dental care including fillings, crowns and bridges, complete and partial dentures, implants, gum treatment, root canals, and extractions. Preventive care is also provided. Treatment is provided to both adults and children. Patients are selected via a lottery and there is often a waiting list.   [facility] [address], [place]  [phone] [email or web address]   [facility] [address] [place], [place] [phone], Ext. [phone] Second and Fourth Thursday of each month, opens at 6:30 AM; Clinic ends at 9 AM.  Patients are seen on a first-come first-served basis, and a limited number are seen during each clinic.   [facility]  [address] Hillard Danker [place], [place] [phone]   Eligibility Requirements You must have lived in [place], [place], or [place] counties for at least the last three months.   You cannot be eligible for state or federal sponsored [employer], including [employer], [place], or [employer].   You generally cannot be eligible for healthcare insurance through your employer.    How to apply: Eligibility screenings are held every Tuesday and Wednesday afternoon from 1:00 pm until 4:00 pm. You do not need an appointment for the interview!  [facility] [address] [place], [place]   [place]  [place] Department  [place]  [phone]    Behavioral Health Resources in the Community: Intensive Outpatient Programs Organization         Address  Phone  Notes  [place]. [address] [place], [place] [phone]   [facility] Outpatient [address] [place], [place]   ADS: Alcohol & Drug S[facility]cs [address] [place], [place]   [place] [address]  [place], [place] or [phone]   Substance Abuse Resources Organization         Address  Phone  Notes  Alcohol and Drug Services  [phone]   [place]  [phone]   The [place]   Chinita Pester  [phone]   Residential & Outpatient Substance Abuse Program  [phone]   Psychological  Services Organization         Address  Phone  Notes  [place]  [place]  336-  [place] [address] [place] or [phone]    Mobile Crisis Teams Organization         Address  Phone  Notes  [facility], Mobile Crisis Care Unit  [phone]   [facility]  [address] [place], [place]   [place] Levels [address], [place] [phone]    Self-Help/Support Groups Organization         Address  Phone             Notes  [place]. of [place] - variety of support groups  [place] Call for more information  Narcotics Anonymous (NA), [facility] [address], [employer] [place]  2 meetings at this location   Special educational needs teacher         Address  Phone  Notes  ASAP Residential Treatment [place],    [place]  [phone]   [facility]  [address], [place] [address]254, [place], [place]   [place], [place] [phone] Admissions: 8am-3pm M-F  Incentives Substance [place] [address]    [place], [place] [phone]   [facility] [address] [place], [place], [place]   [facility] [address],  [place], [place]   [facility] Programs - Intensive Outpatient [address] Kristeen Mans [address], [place], [place]   [facility] ([place].) [place].,  [place], [place] [phone] or [phone]   [facility] Services (RTS) [address] [place], [place] Accepts Medicaid  [facility] [place] [address],  [place] [phone] Substance Abuse/Addiction Treatment   [facility] Organization         Address  Phone  Notes  [facility]  [phone]   Domenic Schwab, PhD [address] Arlis Porta [place], [place]   [phone] or [phone]   [place], [place] [phone]   [facility] [address], [place], [place] [phone] Insurance/Medicaid/sponsorship through [facility] and Families [address] [place]                                    [place], [place] [phone] [place] [address] [place], [place] [phone]    Dr. Adele Schilder  [phone]   [facility] Dept. 1) [address] [place] 2) [place] 3)  [place] 65, [place] [phone]  [phone]   [place] [phone] or [phone] (After Hours)

## 2013-06-30 NOTE — ED Notes (Signed)
C/o L ear pain since yesterday.  States she felt L ear pop and it has felt like fluid in it ever since.

## 2013-06-30 NOTE — ED Provider Notes (Signed)
CSN: [id number]     Arrival date & time 06/30/13  1614 History  This chart was scribed for non-physician practitioner Dierdre ForthHannah Kemonie Cutillo, PA-C working with Shelda JakesScott W. Zackowski, MD by Donne Anonayla Curran, ED Scribe. This patient was seen in room TR06C/TR06C and the patient's care was started at 1723.    First MD Initiated Contact with Patient 06/30/13 1723     Chief Complaint  Patient presents with  . Otalgia     The history is provided by the patient and medical records. No language interpreter was used.   HPI Comments: Bridget Summers is a 28 y.o. female who presents to the Emergency Department complaining of 1 day of sudden onset, gradually worsening left sided ear pain. She states she heard a pop when the pain began, and it feels like there is fluid in the ear, but she has had no drainage from the ear. She reports associated intermittent Left jaw pain and sore throat, but denies dental pain.  She has not tried any medication for her pain. She denies congestion, nasal congestion, rhinorrhea or any other symptoms. She denies exposure to sick individuals. She states she is otherwise healthy. She denies antibiotic use in the past 6 months.   She does not currently have a PCP.   Past Medical History  Diagnosis Date  . Ovarian cyst    History reviewed. No pertinent past surgical history. Family History  Problem Relation Age of Onset  . Diabetes Paternal Grandfather    History  Substance Use Topics  . Smoking status: Never Smoker   . Smokeless tobacco: Never Used  . Alcohol Use: Yes     Comment: mosly wine on weekends   OB History   Grav Para Term Preterm Abortions TAB SAB Ect Mult Living                 Review of Systems  Constitutional: Negative for fever and fatigue.  HENT: Positive for ear pain. Negative for hearing loss, mouth sores and trouble swallowing.   Eyes: Negative for visual disturbance.  Respiratory: Negative for cough and wheezing.   Cardiovascular: Negative for  chest pain.  Gastrointestinal: Negative for nausea and vomiting.  Musculoskeletal: Negative for neck pain and neck stiffness.  Skin: Negative for rash.  Allergic/Immunologic: Negative for immunocompromised state.  Neurological: Negative for light-headedness and headaches.  All other systems reviewed and are negative.      Allergies  Review of patient's allergies indicates no known allergies.  Home Medications  No current outpatient prescriptions on file.  BP 123/66  Pulse 79  Temp(Src) 98.2 F (36.8 C) (Oral)  Resp 16  Wt 196 lb 9 oz (89.16 kg)  SpO2 97%  LMP 04/29/2013  Physical Exam  Constitutional: She appears well-developed and well-nourished. No distress.  HENT:  Head: Normocephalic and atraumatic.  Right Ear: Tympanic membrane, external ear and ear canal normal. No lacerations. No tenderness. No mastoid tenderness. Tympanic membrane is not injected, not erythematous and not bulging. No hemotympanum.  Left Ear: External ear and ear canal normal. No lacerations. No tenderness. No mastoid tenderness. Tympanic membrane is not injected, not erythematous and not bulging. A middle ear effusion (clear fluid) is present. No hemotympanum.  Nose: Mucosal edema present. No rhinorrhea. No epistaxis. Right sinus exhibits no maxillary sinus tenderness and no frontal sinus tenderness. Left sinus exhibits no maxillary sinus tenderness and no frontal sinus tenderness.  Mouth/Throat: Uvula is midline, oropharynx is clear and moist and mucous membranes are normal. Mucous membranes are  not pale and not cyanotic. No uvula swelling. No oropharyngeal exudate, posterior oropharyngeal edema, posterior oropharyngeal erythema or tonsillar abscesses.  Left ear effusion with clear fluid. TM non-erythematous and non-bulging. Right ear is clear. No tonsillar or cervical lymphadenopathy.   Eyes: Conjunctivae are normal. Pupils are equal, round, and reactive to light.  Neck: Normal range of motion and full  passive range of motion without pain.  Cardiovascular: Normal rate, normal heart sounds and intact distal pulses.   No murmur heard. Pulmonary/Chest: Effort normal and breath sounds normal. No stridor. No respiratory distress. She has no wheezes.  Musculoskeletal: Normal range of motion. She exhibits no tenderness.  Lymphadenopathy:    She has no cervical adenopathy.  Neurological: She is alert. No cranial nerve deficit.  Skin: Skin is warm and dry. No rash noted. She is not diaphoretic.  Psychiatric: She has a normal mood and affect.    ED Course  Procedures (including critical care time) DIAGNOSTIC STUDIES: Oxygen Saturation is 97% on RA, adequate by my interpretation.    COORDINATION OF CARE: 5:36 PM Discussed treatment plan which includes Afrin nasal spray with pt at bedside and pt agreed to plan. Advised pt to return if symptoms persist after 3 days.   6:06 PM Rechecked pt, who reports mild improvement in pain with the nasal spray. Will discharge home.   Labs Review Labs Reviewed - No data to display Imaging Review No results found.  EKG Interpretation   None       MDM   Final diagnoses:  Otalgia of left ear   Bridget Sheriff presents with L otalgia. Exam with small clear effusion but no erythema, old and TM are evidence of OE or OM.  TM is intact and without perforation.  Hearing is intact bilaterally. Patient also noted to have mild mucosal edema but without rhinorrhea.  Patient given aspirin with small amount of improvement. Recommend she use this for 3 days and followup with her primary care physician or return here to the emergency department if she develops fever, decreased hearing or increased pain.  It has been determined that no acute conditions requiring further emergency intervention are present at this time. The patient/guardian have been advised of the diagnosis and plan. We have discussed signs and symptoms that warrant return to the ED, such as changes  or worsening in symptoms.   Vital signs are stable at discharge.   BP 123/66  Pulse 79  Temp(Src) 98.2 F (36.8 C) (Oral)  Resp 16  Wt 196 lb 9 oz (89.16 kg)  SpO2 97%  LMP 04/29/2013  Patient/guardian has voiced understanding and agreed to follow-up with the PCP or specialist.    I personally performed the services described in this documentation, which was scribed in my presence. The recorded information has been reviewed and is accurate.   Dahlia Client Lysha Schrade, PA-C 06/30/13 1813

## 2013-07-01 NOTE — ED Provider Notes (Signed)
Medical screening examination/treatment/procedure(s) were performed by non-physician practitioner and as supervising physician I was immediately available for consultation/collaboration.  EKG Interpretation   None         Shelda JakesScott W. Ebonique Hallstrom, MD 07/01/13 1622

## 2015-12-18 ENCOUNTER — Emergency Department (HOSPITAL_COMMUNITY)
Admission: EM | Admit: 2015-12-18 | Discharge: 2015-12-19 | Disposition: A | Discharge status: Home / Self Care | Payer: Medicaid Other | Attending: Emergency Medicine | Admitting: Emergency Medicine

## 2015-12-18 ENCOUNTER — Encounter (HOSPITAL_COMMUNITY): Payer: Self-pay | Admitting: Emergency Medicine

## 2015-12-18 DIAGNOSIS — Z0389 Encounter for observation for other suspected diseases and conditions ruled out: Secondary | ICD-10-CM | POA: Diagnosis not present

## 2015-12-18 DIAGNOSIS — T192XXA Foreign body in vulva and vagina, initial encounter: Secondary | ICD-10-CM

## 2015-12-18 NOTE — ED Notes (Signed)
Pt OOB to restroom to provide urine sample at this time.

## 2015-12-18 NOTE — ED Triage Notes (Signed)
The pt inserted a detox  Pearl ikn her vagina 2 days ago  Its round and she cannot get it out  lmp one week ago

## 2015-12-18 NOTE — ED Triage Notes (Signed)
Pt. reports " cleansing pearl" is stuck in her vagina for 2 nights , pt. stated the string broke and it can no be pulled out , denies worsening vaginal discomfort / no bleeding .

## 2015-12-19 NOTE — ED Notes (Signed)
Pt d/c home with daughter. D/c and follow up instructions discussed w/ pt.

## 2015-12-19 NOTE — ED Provider Notes (Signed)
[facility] Provider Note   CSN: [id number] Arrival date & time: 12/18/15  6295  First Provider Contact:  First MD Initiated Contact with Patient 12/18/15 2357        History   Chief Complaint Chief Complaint  Patient presents with  . Foreign Body in Vagina    HPI Bridget Summers is a 30 y.o. female.  This a normally healthy 30 year old female who was using a cleansing.  PERRL after her menstrual cycle.  It normally has a string.  The string has disappeared.  It's been in place for approximately 3 days.  She's having some vaginal discomfort, but no discharge      Past Medical History:  Diagnosis Date  . Ovarian cyst     Patient Active Problem List   Diagnosis Date Noted  . General counseling for prescription of oral contraceptives 07/23/2012  . Chlamydia trachomatis infection of genitourinary site 07/23/2012  . Other and unspecified ovarian cyst 07/23/2012    History reviewed. No pertinent surgical history.  OB History    No data available       Home Medications    Prior to Admission medications   Not on File    Family History Family History  Problem Relation Age of Onset  . Diabetes Paternal Grandfather     Social History Social History  Substance Use Topics  . Smoking status: Never Smoker  . Smokeless tobacco: Never Used  . Alcohol use Yes     Comment: mosly wine on weekends     Allergies   Review of patient's allergies indicates no known allergies.   Review of Systems Review of Systems  Genitourinary: Positive for vaginal pain. Negative for vaginal discharge.  All other systems reviewed and are negative.    Physical Exam Updated Vital Signs BP 115/62   Pulse 64   Temp 98.3 F (36.8 C) (Oral)   Resp 16   Wt 103.1 kg   LMP 12/11/2015 (Approximate)   SpO2 100%   BMI 37.81 kg/m   Physical Exam  Constitutional: She appears well-developed and well-nourished.  Abdominal: She exhibits no distension. There is no  tenderness.  Genitourinary: Uterus normal. Vaginal discharge found.  Genitourinary Comments: No foreign body visualized or palpated  Neurological: She is alert.  Skin: Skin is warm.  Nursing note and vitals reviewed.    ED Treatments / Results  Labs (all labs ordered are listed, but only abnormal results are displayed) Labs Reviewed - No data to display  EKG  EKG Interpretation None       Radiology No results found.  Procedures Procedures (including critical care time)  Medications Ordered in ED Medications - No data to display   Initial Impression / Assessment and Plan / ED Course  I have reviewed the triage vital signs and the nursing notes.  Pertinent labs & imaging results that were available during my care of the patient were reviewed by me and considered in my medical decision making (see chart for details).  Clinical Course     Pelvic exam does not reveal any foreign body.  There is small amount of discharge.  Cervix is closed, nontender, will referred to [facility] clinic for follow-up if needed  Final Clinical Impressions(s) / ED Diagnoses   Final diagnoses:  Foreign body in vagina, initial encounter    New Prescriptions New Prescriptions   No medications on file     Earley Favor, NP 12/19/15 0027    Earley Favor, NP 12/19/15 0028  Tilden Fossa, MD 12/20/15 1350

## 2015-12-19 NOTE — Discharge Instructions (Signed)
No foreign object was identified in your vaginal vault.  If you still feel uncomfortable develop vaginal discharge.  You have been given a referral to women's GYN clinic.  Please make an appointment for follow-up as needed

## 2015-12-19 NOTE — ED Notes (Signed)
Pelvic exam performed by female NP, chaperoned by female RN. Pt tolerated well.

## 2017-05-28 ENCOUNTER — Other Ambulatory Visit: Payer: Self-pay

## 2017-05-28 ENCOUNTER — Ambulatory Visit (HOSPITAL_COMMUNITY)
Admission: EM | Admit: 2017-05-28 | Discharge: 2017-05-28 | Disposition: A | Discharge status: Home / Self Care | Payer: Medicaid Other | Attending: Family Medicine | Admitting: Family Medicine

## 2017-05-28 ENCOUNTER — Encounter (HOSPITAL_COMMUNITY): Payer: Self-pay | Admitting: Emergency Medicine

## 2017-05-28 DIAGNOSIS — J029 Acute pharyngitis, unspecified: Secondary | ICD-10-CM | POA: Insufficient documentation

## 2017-05-28 DIAGNOSIS — R05 Cough: Secondary | ICD-10-CM | POA: Insufficient documentation

## 2017-05-28 LAB — POCT RAPID STREP A: Streptococcus, Group A Screen (Direct): NEGATIVE

## 2017-05-28 NOTE — ED Notes (Signed)
Patient discharged by provider.

## 2017-05-28 NOTE — ED Provider Notes (Signed)
[facility]    CSN: [id number] Arrival date & time: 05/28/17  1513     History   Chief Complaint Chief Complaint  Patient presents with  . Sore Throat    HPI Bridget Summers is a 32 y.o. female presenting with sore throat x 2 days. Cough and congestion developed today. Decreased oral intake due to pain. Denies fever, nausea, vomiting, abdominal pain, chest pain, shortness of breath. No neck pain.   HPI  Past Medical History:  Diagnosis Date  . Ovarian cyst     Patient Active Problem List   Diagnosis Date Noted  . General counseling for prescription of oral contraceptives 07/23/2012  . Chlamydia trachomatis infection of genitourinary site 07/23/2012  . Other and unspecified ovarian cyst 07/23/2012    History reviewed. No pertinent surgical history.  OB History    No data available       Home Medications    Prior to Admission medications   Not on File    Family History Family History  Problem Relation Age of Onset  . Diabetes Paternal Grandfather     Social History Social History   Tobacco Use  . Smoking status: Never Smoker  . Smokeless tobacco: Never Used  Substance Use Topics  . Alcohol use: Yes    Comment: mosly wine on weekends  . Drug use: No     Allergies   Patient has no known allergies.   Review of Systems Review of Systems  Constitutional: Negative for chills, fatigue and fever.  HENT: Positive for congestion, rhinorrhea and sore throat. Negative for ear pain, sinus pressure and trouble swallowing.   Respiratory: Positive for cough. Negative for chest tightness and shortness of breath.   Cardiovascular: Negative for chest pain.  Gastrointestinal: Negative for abdominal pain, nausea and vomiting.  Musculoskeletal: Negative for myalgias.  Skin: Negative for rash.  Neurological: Negative for dizziness, light-headedness and headaches.     Physical Exam Triage Vital Signs ED Triage Vitals  Enc Vitals Group     BP  05/28/17 1555 (!) 110/53     Pulse Rate 05/28/17 1555 85     Resp 05/28/17 1555 16     Temp 05/28/17 1555 98.5 F (36.9 C)     Temp src --      SpO2 05/28/17 1555 100 %     Weight --      Height --      Head Circumference --      Peak Flow --      Pain Score 05/28/17 1554 5     Pain Loc --      Pain Edu? --      Excl. in GC? --    No data found.  Updated Vital Signs BP (!) 110/53   Pulse 85   Temp 98.5 F (36.9 C)   Resp 16   LMP 04/18/2017   SpO2 100%    Physical Exam  Constitutional: She is oriented to person, place, and time. She appears well-developed and well-nourished. No distress.  Sitting comfotably, NAD  HENT:  Head: Normocephalic and atraumatic.  Right Ear: Tympanic membrane and ear canal normal. Tympanic membrane is not erythematous.  Left Ear: Tympanic membrane and ear canal normal. Tympanic membrane is not erythematous.  Nose: Nose normal.  Mouth/Throat: Uvula is midline and mucous membranes are normal. No oral lesions. No trismus in the jaw. No uvula swelling. Posterior oropharyngeal erythema present. No oropharyngeal exudate, posterior oropharyngeal edema or tonsillar abscesses. Tonsils are 1+  on the right. Tonsils are 1+ on the left. No tonsillar exudate.  No tonsillar enlargement or erythema  Eyes: Conjunctivae are normal.  Neck: Neck supple.  Cardiovascular: Normal rate and regular rhythm.  No murmur heard. Pulmonary/Chest: Effort normal and breath sounds normal. No respiratory distress. She has no wheezes. She has no rales.  Abdominal: Soft. There is no tenderness.  Musculoskeletal: She exhibits no edema.  Neurological: She is alert and oriented to person, place, and time.  Skin: Skin is warm and dry.  Psychiatric: She has a normal mood and affect.  Nursing note and vitals reviewed.    UC Treatments / Results  Labs (all labs ordered are listed, but only abnormal results are displayed) Labs Reviewed - No data to display  EKG  EKG  Interpretation None       Radiology No results found.  Procedures Procedures (including critical care time)  Medications Ordered in UC Medications - No data to display   Initial Impression / Assessment and Plan / UC Course  I have reviewed the triage vital signs and the nursing notes.  Pertinent labs & imaging results that were available during my care of the patient were reviewed by me and considered in my medical decision making (see chart for details).     Patient tested negative for strep. No evidence of peritonsillar abscess or retropharyngeal abscess. Patient is nontoxic appearing, no drooling, dysphagia, muffled voice, or tripoding. No trismus. Likely viral URI vs post nasal drip. Supportive treatment. Discussed strict return precautions. Patient verbalized understanding and is agreeable with plan.   Final Clinical Impressions(s) / UC Diagnoses   Final diagnoses:  None    ED Discharge Orders    None       Controlled Substance Prescriptions [place] Controlled Substance Registry consulted? Not Applicable   Lew Dawes, [place] 05/28/17 1703

## 2017-05-28 NOTE — Discharge Instructions (Signed)
Sore Throat  Your rapid strep tested Negative today. We will send for a culture and call in about 2 days if results are positive. For now we will treat your sore throat as a virus with symptom management.   Please continue Tylenol or Ibuprofen for fever and pain. May try salt water gargles, cepacol lozenges, throat spray, or OTC cold relief medicine for throat discomfort. If you also have congestion take a daily anti-histamine like Zyrtec, Claritin, and a oral decongestant to help with post nasal drip that may be irritating your throat.   Stay hydrated and drink plenty of fluids to keep your throat coated relieve irritation.   For sore throat try using a honey-based tea. Use 3 teaspoons of honey with juice squeezed from half lemon. Place shaved pieces of ginger into 1/2-1 cup of water and warm over stove top. Then mix the ingredients and repeat every 4 hours as needed.

## 2017-05-28 NOTE — ED Triage Notes (Signed)
Pt c/o sore throat x 3 days

## 2017-05-31 LAB — CULTURE, GROUP A STREP (THRC)

## 2018-01-06 ENCOUNTER — Encounter (HOSPITAL_COMMUNITY): Payer: Self-pay | Admitting: *Deleted

## 2018-01-06 ENCOUNTER — Inpatient Hospital Stay (HOSPITAL_COMMUNITY)
Admission: AD | Admit: 2018-01-06 | Discharge: 2018-01-06 | Disposition: A | Discharge status: Home / Self Care | Payer: Medicaid Other | Source: Ambulatory Visit | Attending: Obstetrics and Gynecology | Admitting: Obstetrics and Gynecology

## 2018-01-06 ENCOUNTER — Inpatient Hospital Stay (HOSPITAL_COMMUNITY): Payer: Medicaid Other

## 2018-01-06 DIAGNOSIS — O208 Other hemorrhage in early pregnancy: Secondary | ICD-10-CM | POA: Diagnosis not present

## 2018-01-06 DIAGNOSIS — Z3A01 Less than 8 weeks gestation of pregnancy: Secondary | ICD-10-CM | POA: Insufficient documentation

## 2018-01-06 DIAGNOSIS — O3481 Maternal care for other abnormalities of pelvic organs, first trimester: Secondary | ICD-10-CM | POA: Diagnosis not present

## 2018-01-06 DIAGNOSIS — O209 Hemorrhage in early pregnancy, unspecified: Secondary | ICD-10-CM | POA: Insufficient documentation

## 2018-01-06 DIAGNOSIS — Z3491 Encounter for supervision of normal pregnancy, unspecified, first trimester: Secondary | ICD-10-CM

## 2018-01-06 LAB — CBC
HCT: 32.6 % — ABNORMAL LOW (ref 36.0–46.0)
Hemoglobin: 10.6 g/dL — ABNORMAL LOW (ref 12.0–15.0)
MCH: 21.6 pg — ABNORMAL LOW (ref 26.0–34.0)
MCHC: 32.5 g/dL (ref 30.0–36.0)
MCV: 66.4 fL — ABNORMAL LOW (ref 78.0–100.0)
PLATELETS: 246 10*3/uL (ref 150–400)
RBC: 4.91 MIL/uL (ref 3.87–5.11)
RDW: 15.8 % — AB (ref 11.5–15.5)
WBC: 7 10*3/uL (ref 4.0–10.5)

## 2018-01-06 LAB — URINALYSIS, ROUTINE W REFLEX MICROSCOPIC
BILIRUBIN URINE: NEGATIVE
Glucose, UA: NEGATIVE mg/dL
KETONES UR: 5 mg/dL — AB
LEUKOCYTES UA: NEGATIVE
NITRITE: NEGATIVE
Protein, ur: NEGATIVE mg/dL
Specific Gravity, Urine: 1.023 (ref 1.005–1.030)
pH: 5 (ref 5.0–8.0)

## 2018-01-06 LAB — POCT PREGNANCY, URINE: PREG TEST UR: POSITIVE — AB

## 2018-01-06 LAB — WET PREP, GENITAL
Clue Cells Wet Prep HPF POC: NONE SEEN
Sperm: NONE SEEN
Trich, Wet Prep: NONE SEEN
Yeast Wet Prep HPF POC: NONE SEEN

## 2018-01-06 LAB — HCG, QUANTITATIVE, PREGNANCY: hCG, Beta Chain, Quant, S: 3761 m[IU]/mL — ABNORMAL HIGH (ref <5)

## 2018-01-06 LAB — ABO/RH: ABO/RH(D): A POS

## 2018-01-06 NOTE — MAU Note (Signed)
Pt reports positive preg test at home yesterday, started having pink discharge

## 2018-01-06 NOTE — Discharge Instructions (Signed)

## 2018-01-06 NOTE — MAU Provider Note (Signed)
History     CSN: [id number]  Arrival date and time: 01/06/18 1754   First Provider Initiated Contact with Patient 01/06/18 1857      Chief Complaint  Patient presents with  . Possible Pregnancy  . Vaginal Bleeding   HPI Bridget Summers is a 32 y.o. G3P1011 at 4870w4d who presents with vaginal bleeding. She reports 3 positive home pregnancy tests yesterday and spotting that started today. She denies any pain. She reports an LMP in July. She has not been seen anywhere for this pregnancy.   OB History    Gravida  3   Para  1   Term  1   Preterm      AB  1   Living  1     SAB      TAB  1   Ectopic      Multiple      Live Births              Past Medical History:  Diagnosis Date  . Ovarian cyst     History reviewed. No pertinent surgical history.  Family History  Problem Relation Age of Onset  . Diabetes Paternal Grandfather     Social History   Tobacco Use  . Smoking status: Never Smoker  . Smokeless tobacco: Never Used  Substance Use Topics  . Alcohol use: Yes    Comment: mosly wine on weekends  . Drug use: No    Allergies: No Known Allergies  No medications prior to admission.    Review of Systems  Constitutional: Negative.  Negative for fatigue and fever.  HENT: Negative.   Respiratory: Negative.  Negative for shortness of breath.   Cardiovascular: Negative.  Negative for chest pain.  Gastrointestinal: Negative.  Negative for abdominal pain, constipation, diarrhea, nausea and vomiting.  Genitourinary: Positive for vaginal bleeding. Negative for dysuria and vaginal discharge.  Neurological: Negative.  Negative for dizziness and headaches.   Physical Exam   Blood pressure 124/72, pulse 77, temperature 98.3 F (36.8 C), temperature source Oral, resp. rate 16, height 5' 5.75" (1.67 m), weight 101.2 kg, last menstrual period 11/28/2017, SpO2 100 %.  Physical Exam  Nursing note and vitals reviewed. Constitutional: She is oriented to  person, place, and time. She appears well-developed and well-nourished. No distress.  HENT:  Head: Normocephalic.  Eyes: Pupils are equal, round, and reactive to light.  Cardiovascular: Normal rate, regular rhythm and normal heart sounds.  Respiratory: Effort normal and breath sounds normal. No respiratory distress.  GI: Soft. Bowel sounds are normal. She exhibits no distension. There is no tenderness.  Genitourinary:  Genitourinary Comments: Pelvic exam: Cervix pink, visually closed, without lesion, small amount of bright red blood at cervical os, vaginal walls and external genitalia normal Bimanual exam: Cervix 0/long/high, firm, anterior, neg CMT, uterus nontender, nonenlarged, adnexa without tenderness, enlargement, or mass   Neurological: She is alert and oriented to person, place, and time.  Skin: Skin is warm and dry.  Psychiatric: She has a normal mood and affect. Her behavior is normal. Judgment and thought content normal.    MAU Course  Procedures Results for orders placed or performed during the hospital encounter of 01/06/18 (from the past 24 hour(s))  Urinalysis, Routine w reflex microscopic     Status: Abnormal   Collection Time: 01/06/18  6:51 PM  Result Value Ref Range   Color, Urine YELLOW YELLOW   APPearance HAZY (A) CLEAR   Specific Gravity, Urine 1.023 1.005 - 1.030  pH 5.0 5.0 - 8.0   Glucose, UA NEGATIVE NEGATIVE mg/dL   Hgb urine dipstick LARGE (A) NEGATIVE   Bilirubin Urine NEGATIVE NEGATIVE   Ketones, ur 5 (A) NEGATIVE mg/dL   Protein, ur NEGATIVE NEGATIVE mg/dL   Nitrite NEGATIVE NEGATIVE   Leukocytes, UA NEGATIVE NEGATIVE   RBC / HPF 0-5 0 - 5 RBC/hpf   WBC, UA 0-5 0 - 5 WBC/hpf   Bacteria, UA RARE (A) NONE SEEN   Squamous Epithelial / LPF 6-10 0 - 5  Pregnancy, urine POC     Status: Abnormal   Collection Time: 01/06/18  6:55 PM  Result Value Ref Range   Preg Test, Ur POSITIVE (A) NEGATIVE  CBC     Status: Abnormal   Collection Time: 01/06/18   7:14 PM  Result Value Ref Range   WBC 7.0 4.0 - 10.5 K/uL   RBC 4.91 3.87 - 5.11 MIL/uL   Hemoglobin 10.6 (L) 12.0 - 15.0 g/dL   HCT 16.1 (L) 09.6 - 04.5 %   MCV 66.4 (L) 78.0 - 100.0 fL   MCH 21.6 (L) 26.0 - 34.0 pg   MCHC 32.5 30.0 - 36.0 g/dL   RDW 40.9 (H) 81.1 - 91.4 %   Platelets 246 150 - 400 K/uL  ABO/Rh     Status: None (Preliminary result)   Collection Time: 01/06/18  7:14 PM  Result Value Ref Range   ABO/RH(D)      A POS Performed at [facility], [address] [place], [place] [address]   Wet prep, genital     Status: Abnormal   Collection Time: 01/06/18  7:28 PM  Result Value Ref Range   Yeast Wet Prep HPF POC NONE SEEN NONE SEEN   Trich, Wet Prep NONE SEEN NONE SEEN   Clue Cells Wet Prep HPF POC NONE SEEN NONE SEEN   WBC, Wet Prep HPF POC FEW (A) NONE SEEN   Sperm NONE SEEN    US Ob Less Than 14 Weeks With Ob Transvaginal  Result Date: 01/06/2018 CLINICAL DATA:  Spotting with positive urine pregnancy test EXAM: OBSTETRIC <14 WK [place] AND TRANSVAGINAL OB US TECHNIQUE: Both transabdominal and transvaginal ultrasound examinations were performed for complete evaluation of the gestation as well as the maternal uterus, adnexal regions, and pelvic cul-de-sac. Transvaginal technique was performed to assess early pregnancy. COMPARISON:  None. FINDINGS: Intrauterine gestational sac: Single intrauterine gestational sac Yolk sac:  Visible Embryo:  Not visible MSD: 6.4 mm   5 w   2 d Subchorionic hemorrhage:  None visualized. Maternal uterus/adnexae: Ovaries are within normal limits. The right ovary measures 1.8 x 2.8 x 1.6 cm. Left ovary measures 3.1 x 4.3 x 2.7 cm. Small corpus luteal cyst in the left ovary. Trace free fluid IMPRESSION: Single intrauterine pregnancy with visible gestational sac and yolk sac but no embryo. Consider sonographic follow-up in 10-14 days to confirm viability. Trace free fluid in the pelvis. Electronically Signed   By: Jasmine Pang M.D.   On: 01/06/2018  20:01   MDM UA, UPT CBC, HCG ABO/Rh- A Pos Wet prep and gc/chlamydia US OB Comp Less 14 weeks with Transvaginal  Assessment and Plan   1. Normal intrauterine pregnancy on prenatal ultrasound in first trimester   2. Vaginal bleeding affecting early pregnancy   3. [redacted] weeks gestation of pregnancy    -Discharge home in stable condition -First trimester bleeding precautions discussed -Patient advised to follow-up with [facility] to start prenatal care -Patient may return  to MAU as needed or if her condition were to change or worsen  Rolm BookbinderCaroline M Shemeca Lukasik CNM 01/06/2018, 6:57 PM

## 2018-01-07 LAB — GC/CHLAMYDIA PROBE AMP (~~LOC~~) NOT AT ARMC
CHLAMYDIA, DNA PROBE: NEGATIVE
NEISSERIA GONORRHEA: NEGATIVE

## 2018-01-23 ENCOUNTER — Encounter (HOSPITAL_COMMUNITY): Payer: Self-pay

## 2018-01-23 ENCOUNTER — Ambulatory Visit (HOSPITAL_COMMUNITY)
Admission: EM | Admit: 2018-01-23 | Discharge: 2018-01-23 | Disposition: A | Discharge status: Home / Self Care | Payer: Medicaid Other | Attending: Family Medicine | Admitting: Family Medicine

## 2018-01-23 DIAGNOSIS — N760 Acute vaginitis: Secondary | ICD-10-CM

## 2018-01-23 MED ORDER — FLUCONAZOLE 150 MG PO TABS: 150.0000 mg | ORAL_TABLET | Freq: Every day | ORAL | 0 refills | Status: DC

## 2018-01-23 MED ORDER — TERCONAZOLE 0.4 % VA CREA: 1.0000 | TOPICAL_CREAM | Freq: Every day | VAGINAL | 0 refills | Status: DC

## 2018-01-23 MED ORDER — METRONIDAZOLE 500 MG PO TABS: 500.0000 mg | ORAL_TABLET | Freq: Two times a day (BID) | ORAL | 0 refills | Status: DC

## 2018-01-23 NOTE — ED Triage Notes (Addendum)
Pt presents with vaginal irritation. Reports history of yeast infection. States used new lube with partner. No concern for std. Pt is [redacted] weeks pregnant. Has obgyn appointment next week.

## 2018-01-23 NOTE — Discharge Instructions (Addendum)
May take the metronidazole 2 times a day for 7 days You may not take the Diflucan while pregnant.  I have prescribed terconazole vaginal cream. Follow-up with your OB/GYN

## 2018-01-24 NOTE — ED Provider Notes (Signed)
[facility]    CSN: [id number] Arrival date & time: 01/23/18  0803     History   Chief Complaint Chief Complaint  Patient presents with  . Vaginal Itching    HPI Bridget Summers is a 32 y.o. female.   HPI  Patient is here for vaginal discharge and itching.  She feels like she has a yeast infection.  She is also pregnant.  She states the used a new lubricant during sexual relations, and she thinks this may have caused some irritation.  She has recurring BV.  She has not noticed any odor.  She would like to be treated for both.  She states she has a very stable relationship and does not need treatment for STDs.  No dysuria or frequency.  Past Medical History:  Diagnosis Date  . Ovarian cyst     Patient Active Problem List   Diagnosis Date Noted  . General counseling for prescription of oral contraceptives 07/23/2012  . Chlamydia trachomatis infection of genitourinary site 07/23/2012  . Other and unspecified ovarian cyst 07/23/2012    History reviewed. No pertinent surgical history.  OB History    Gravida  3   Para  1   Term  1   Preterm      AB  1   Living  1     SAB      TAB  1   Ectopic      Multiple      Live Births               Home Medications    Prior to Admission medications   Medication Sig Start Date End Date Taking? Authorizing Provider  prenatal vitamin w/FE, FA (NATACHEW) 29-1 MG CHEW chewable tablet Chew 1 tablet by mouth daily at 12 noon.   Yes [provider]  metroNIDAZOLE (FLAGYL) 500 MG tablet Take 1 tablet (500 mg total) by mouth 2 (two) times daily. 01/23/18   Eustace Moore, MD  terconazole (TERAZOL 7) 0.4 % vaginal cream Place 1 applicator vaginally at bedtime. 01/23/18   Eustace Moore, MD    Family History Family History  Problem Relation Age of Onset  . Diabetes Paternal Grandfather   . Healthy Mother   . Lupus Father     Social History Social History   Tobacco Use  . Smoking status:  Never Smoker  . Smokeless tobacco: Never Used  Substance Use Topics  . Alcohol use: Yes    Comment: mosly wine on weekends  . Drug use: No     Allergies   Patient has no known allergies.   Review of Systems Review of Systems  Constitutional: Negative for chills and fever.  HENT: Negative for ear pain and sore throat.   Eyes: Negative for pain and visual disturbance.  Respiratory: Negative for cough and shortness of breath.   Cardiovascular: Negative for chest pain and palpitations.  Gastrointestinal: Negative for abdominal pain and vomiting.  Genitourinary: Positive for vaginal discharge. Negative for dysuria and hematuria.  Musculoskeletal: Negative for arthralgias and back pain.  Skin: Negative for color change and rash.  Neurological: Negative for seizures and syncope.  All other systems reviewed and are negative.    Physical Exam Triage Vital Signs ED Triage Vitals  Enc Vitals Group     BP 01/23/18 0827 (!) 123/56     Pulse Rate 01/23/18 0827 73     Resp 01/23/18 0827 18     Temp 01/23/18 0827  98.1 F (36.7 C)     Temp src --      SpO2 01/23/18 0827 97 %   No data found.  Updated Vital Signs BP (!) 123/56   Pulse 73   Temp 98.1 F (36.7 C)   Resp 18   LMP 11/28/2017   SpO2 97%       Physical Exam  Constitutional: She appears well-developed and well-nourished. No distress.  HENT:  Head: Normocephalic and atraumatic.  Mouth/Throat: Oropharynx is clear and moist.  Eyes: Pupils are equal, round, and reactive to light. Conjunctivae are normal.  Neck: Normal range of motion.  Cardiovascular: Normal rate.  Pulmonary/Chest: Effort normal. No respiratory distress.  Abdominal: Soft. She exhibits no distension.  Musculoskeletal: Normal range of motion. She exhibits no edema.  Neurological: She is alert.  Skin: Skin is warm and dry.  Vitals reviewed.    UC Treatments / Results  Labs (all labs ordered are listed, but only abnormal results are  displayed) Labs Reviewed - No data to display  EKG None  Radiology No results found.  Procedures Procedures (including critical care time)  Medications Ordered in UC Medications - No data to display  Initial Impression / Assessment and Plan / UC Course  I have reviewed the triage vital signs and the nursing notes.  Pertinent labs & imaging results that were available during my care of the patient were reviewed by me and considered in my medical decision making (see chart for details).      Final Clinical Impressions(s) / UC Diagnoses   Final diagnoses:  Vaginitis and vulvovaginitis     Discharge Instructions     May take the metronidazole 2 times a day for 7 days You may not take the Diflucan while pregnant.  I have prescribed terconazole vaginal cream. Follow-up with your OB/GYN   ED Prescriptions    Medication Sig Dispense Auth. Provider   metroNIDAZOLE (FLAGYL) 500 MG tablet Take 1 tablet (500 mg total) by mouth 2 (two) times daily. 14 tablet Eustace Moore, MD   terconazole (TERAZOL 7) 0.4 % vaginal cream Place 1 applicator vaginally at bedtime. 45 g Eustace Moore, MD     Controlled Substance Prescriptions [place] Controlled Substance Registry consulted? Not Applicable   Eustace Moore, MD 01/24/18 [phone]

## 2018-01-29 DIAGNOSIS — O3680X9 Pregnancy with inconclusive fetal viability, other fetus: Secondary | ICD-10-CM | POA: Diagnosis not present

## 2018-01-29 DIAGNOSIS — N925 Other specified irregular menstruation: Secondary | ICD-10-CM | POA: Diagnosis not present

## 2018-01-29 DIAGNOSIS — Z3A08 8 weeks gestation of pregnancy: Secondary | ICD-10-CM | POA: Diagnosis not present

## 2018-01-29 LAB — OB RESULTS CONSOLE ABO/RH: RH Type: POSITIVE

## 2018-01-29 LAB — OB RESULTS CONSOLE RPR: RPR: NONREACTIVE

## 2018-01-29 LAB — OB RESULTS CONSOLE GC/CHLAMYDIA
Chlamydia: NEGATIVE
Gonorrhea: NEGATIVE

## 2018-01-29 LAB — OB RESULTS CONSOLE ANTIBODY SCREEN: Antibody Screen: NEGATIVE

## 2018-01-29 LAB — OB RESULTS CONSOLE HIV ANTIBODY (ROUTINE TESTING): HIV: NONREACTIVE

## 2018-01-29 LAB — OB RESULTS CONSOLE RUBELLA ANTIBODY, IGM: Rubella: IMMUNE

## 2018-01-29 LAB — OB RESULTS CONSOLE HEPATITIS B SURFACE ANTIGEN: Hepatitis B Surface Ag: NEGATIVE

## 2018-02-17 DIAGNOSIS — Z3A11 11 weeks gestation of pregnancy: Secondary | ICD-10-CM | POA: Diagnosis not present

## 2018-02-17 DIAGNOSIS — O3680X9 Pregnancy with inconclusive fetal viability, other fetus: Secondary | ICD-10-CM | POA: Diagnosis not present

## 2018-02-17 DIAGNOSIS — Z3491 Encounter for supervision of normal pregnancy, unspecified, first trimester: Secondary | ICD-10-CM | POA: Diagnosis not present

## 2018-03-17 DIAGNOSIS — Z3402 Encounter for supervision of normal first pregnancy, second trimester: Secondary | ICD-10-CM | POA: Diagnosis not present

## 2018-03-17 DIAGNOSIS — Z363 Encounter for antenatal screening for malformations: Secondary | ICD-10-CM | POA: Diagnosis not present

## 2018-03-17 DIAGNOSIS — N898 Other specified noninflammatory disorders of vagina: Secondary | ICD-10-CM | POA: Diagnosis not present

## 2018-03-17 DIAGNOSIS — D509 Iron deficiency anemia, unspecified: Secondary | ICD-10-CM | POA: Diagnosis not present

## 2018-03-17 DIAGNOSIS — Z3A15 15 weeks gestation of pregnancy: Secondary | ICD-10-CM | POA: Diagnosis not present

## 2018-04-15 DIAGNOSIS — Z3A19 19 weeks gestation of pregnancy: Secondary | ICD-10-CM | POA: Diagnosis not present

## 2018-04-15 DIAGNOSIS — N898 Other specified noninflammatory disorders of vagina: Secondary | ICD-10-CM | POA: Diagnosis not present

## 2018-04-15 DIAGNOSIS — Z363 Encounter for antenatal screening for malformations: Secondary | ICD-10-CM | POA: Diagnosis not present

## 2018-05-19 DIAGNOSIS — Z3A24 24 weeks gestation of pregnancy: Secondary | ICD-10-CM | POA: Diagnosis not present

## 2018-05-19 DIAGNOSIS — O359XX9 Maternal care for (suspected) fetal abnormality and damage, unspecified, other fetus: Secondary | ICD-10-CM | POA: Diagnosis not present

## 2018-05-19 DIAGNOSIS — Z362 Encounter for other antenatal screening follow-up: Secondary | ICD-10-CM | POA: Diagnosis not present

## 2018-05-20 NOTE — L&D Delivery Note (Signed)
Operative Delivery Note At 11:36 AM a viable female was delivered via Vaginal, Vacuum Investment banker, operational).  Presentation: vertex; Position: Left,, Occiput,, Posterior; Station: +2.  Verbal consent: obtained from patient.  Risks and benefits discussed in detail.  Risks include, but are not limited to the risks of anesthesia, bleeding, infection, damage to maternal tissues, fetal cephalhematoma.  There is also the risk of inability to effect vaginal delivery of the head, or shoulder dystocia that cannot be resolved by established maneuvers, leading to the need for emergency cesarean section.  Vacuum assisted vaginal delivery performed for prolonged decelerations noted after AROM and patient in the 3rd stage of labor. Patient pushed and brought fetal head to + 2 fetal station, vacuum was then applied at that station: 2 pulls in the green zone, no pop-offs, delivery effected within 1 minute (30 seconds per RN Herbert Seta) from start of vacuum, pressure released in between pushing/pulls.    APGAR: 8, 9; weight 8 lb 3.8 oz (3735 g).   Placenta status: Grossly normal and intact with 3 vessel cord.      Cord:  with the following complications: None.  Cord pH: pending.   Anesthesia:   Instruments: Kiwi Complete Vacuum cup.   Episiotomy: None Lacerations: 1st degree Suture Repair: 3.0 vicryl Est. Blood Loss (mL): 148  Mom to postpartum.  Baby to Couplet care / Skin to Skin.  Konrad Felix, MD.  09/02/2018, 12:59 PM

## 2018-06-22 DIAGNOSIS — Z3A29 29 weeks gestation of pregnancy: Secondary | ICD-10-CM | POA: Diagnosis not present

## 2018-06-22 DIAGNOSIS — Z23 Encounter for immunization: Secondary | ICD-10-CM | POA: Diagnosis not present

## 2018-06-22 DIAGNOSIS — Z3483 Encounter for supervision of other normal pregnancy, third trimester: Secondary | ICD-10-CM | POA: Diagnosis not present

## 2018-07-12 ENCOUNTER — Inpatient Hospital Stay (HOSPITAL_COMMUNITY): Payer: Medicaid Other

## 2018-07-12 ENCOUNTER — Inpatient Hospital Stay (HOSPITAL_COMMUNITY)
Admission: AD | Admit: 2018-07-12 | Discharge: 2018-07-17 | DRG: 833 | Disposition: A | Discharge status: Home / Self Care | Payer: Medicaid Other | Attending: Obstetrics and Gynecology | Admitting: Obstetrics and Gynecology

## 2018-07-12 ENCOUNTER — Encounter (HOSPITAL_COMMUNITY): Payer: Self-pay | Admitting: *Deleted

## 2018-07-12 ENCOUNTER — Other Ambulatory Visit: Payer: Self-pay

## 2018-07-12 DIAGNOSIS — Z3A32 32 weeks gestation of pregnancy: Secondary | ICD-10-CM | POA: Diagnosis not present

## 2018-07-12 DIAGNOSIS — O133 Gestational [pregnancy-induced] hypertension without significant proteinuria, third trimester: Secondary | ICD-10-CM | POA: Diagnosis present

## 2018-07-12 DIAGNOSIS — Z363 Encounter for antenatal screening for malformations: Secondary | ICD-10-CM | POA: Diagnosis not present

## 2018-07-12 DIAGNOSIS — O99013 Anemia complicating pregnancy, third trimester: Secondary | ICD-10-CM | POA: Diagnosis present

## 2018-07-12 DIAGNOSIS — E876 Hypokalemia: Secondary | ICD-10-CM | POA: Diagnosis present

## 2018-07-12 DIAGNOSIS — O9989 Other specified diseases and conditions complicating pregnancy, childbirth and the puerperium: Secondary | ICD-10-CM | POA: Diagnosis not present

## 2018-07-12 DIAGNOSIS — O139 Gestational [pregnancy-induced] hypertension without significant proteinuria, unspecified trimester: Secondary | ICD-10-CM | POA: Clinically undetermined

## 2018-07-12 DIAGNOSIS — D649 Anemia, unspecified: Secondary | ICD-10-CM | POA: Diagnosis present

## 2018-07-12 DIAGNOSIS — N939 Abnormal uterine and vaginal bleeding, unspecified: Secondary | ICD-10-CM

## 2018-07-12 DIAGNOSIS — O4693 Antepartum hemorrhage, unspecified, third trimester: Secondary | ICD-10-CM | POA: Diagnosis not present

## 2018-07-12 DIAGNOSIS — O99283 Endocrine, nutritional and metabolic diseases complicating pregnancy, third trimester: Secondary | ICD-10-CM | POA: Diagnosis present

## 2018-07-12 DIAGNOSIS — O26853 Spotting complicating pregnancy, third trimester: Secondary | ICD-10-CM | POA: Diagnosis not present

## 2018-07-12 LAB — RAPID URINE DRUG SCREEN, HOSP PERFORMED
Amphetamines: NOT DETECTED
BARBITURATES: NOT DETECTED
Benzodiazepines: NOT DETECTED
Cocaine: NOT DETECTED
Opiates: NOT DETECTED
Tetrahydrocannabinol: NOT DETECTED

## 2018-07-12 LAB — TYPE AND SCREEN
ABO/RH(D): A POS
ABO/RH(D): A POS
Antibody Screen: NEGATIVE
Antibody Screen: NEGATIVE

## 2018-07-12 LAB — WET PREP, GENITAL
Clue Cells Wet Prep HPF POC: NONE SEEN
Sperm: NONE SEEN
Trich, Wet Prep: NONE SEEN
Yeast Wet Prep HPF POC: NONE SEEN

## 2018-07-12 LAB — CBC
HCT: 30 % — ABNORMAL LOW (ref 36.0–46.0)
Hemoglobin: 9.2 g/dL — ABNORMAL LOW (ref 12.0–15.0)
MCH: 20.9 pg — ABNORMAL LOW (ref 26.0–34.0)
MCHC: 30.7 g/dL (ref 30.0–36.0)
MCV: 68 fL — ABNORMAL LOW (ref 80.0–100.0)
Platelets: 207 10*3/uL (ref 150–400)
RBC: 4.41 MIL/uL (ref 3.87–5.11)
RDW: 15.9 % — ABNORMAL HIGH (ref 11.5–15.5)
WBC: 9.9 10*3/uL (ref 4.0–10.5)
nRBC: 0 % (ref 0.0–0.2)

## 2018-07-12 LAB — CBC WITH DIFFERENTIAL/PLATELET
ABS IMMATURE GRANULOCYTES: 0.05 10*3/uL (ref 0.00–0.07)
Basophils Absolute: 0 10*3/uL (ref 0.0–0.1)
Basophils Relative: 0 %
Eosinophils Absolute: 0 10*3/uL (ref 0.0–0.5)
Eosinophils Relative: 0 %
HCT: 27.4 % — ABNORMAL LOW (ref 36.0–46.0)
HEMOGLOBIN: 8.3 g/dL — AB (ref 12.0–15.0)
Immature Granulocytes: 1 %
Lymphocytes Relative: 13 %
Lymphs Abs: 1.4 10*3/uL (ref 0.7–4.0)
MCH: 20.7 pg — ABNORMAL LOW (ref 26.0–34.0)
MCHC: 30.3 g/dL (ref 30.0–36.0)
MCV: 68.3 fL — ABNORMAL LOW (ref 80.0–100.0)
Monocytes Absolute: 0.4 10*3/uL (ref 0.1–1.0)
Monocytes Relative: 4 %
Neutro Abs: 9.2 10*3/uL — ABNORMAL HIGH (ref 1.7–7.7)
Neutrophils Relative %: 82 %
Platelets: 200 10*3/uL (ref 150–400)
RBC: 4.01 MIL/uL (ref 3.87–5.11)
RDW: 15.9 % — ABNORMAL HIGH (ref 11.5–15.5)
WBC: 11.1 10*3/uL — ABNORMAL HIGH (ref 4.0–10.5)
nRBC: 0 % (ref 0.0–0.2)

## 2018-07-12 LAB — GROUP B STREP BY PCR: Group B strep by PCR: NEGATIVE

## 2018-07-12 LAB — RPR: RPR Ser Ql: NONREACTIVE

## 2018-07-12 LAB — ABO/RH: ABO/RH(D): A POS

## 2018-07-12 MED ORDER — BETAMETHASONE SOD PHOS & ACET 6 (3-3) MG/ML IJ SUSP
12.0000 mg | INTRAMUSCULAR | Status: AC
  Administered 2018-07-12 – 2018-07-13 (×2): 12 mg via INTRAMUSCULAR
  Filled 2018-07-12 (×2): qty 2

## 2018-07-12 MED ORDER — OXYTOCIN 40 UNITS IN NORMAL SALINE INFUSION - SIMPLE MED: 2.5000 [IU]/h | INTRAVENOUS | Status: DC

## 2018-07-12 MED ORDER — ONDANSETRON HCL 4 MG/2ML IJ SOLN: 4.0000 mg | Freq: Four times a day (QID) | INTRAMUSCULAR | Status: DC | PRN

## 2018-07-12 MED ORDER — OXYCODONE-ACETAMINOPHEN 5-325 MG PO TABS: 1.0000 | ORAL_TABLET | ORAL | Status: DC | PRN

## 2018-07-12 MED ORDER — ACETAMINOPHEN 325 MG PO TABS: 650.0000 mg | ORAL_TABLET | ORAL | Status: DC | PRN

## 2018-07-12 MED ORDER — LACTATED RINGERS IV SOLN
INTRAVENOUS | Status: DC
  Administered 2018-07-12 (×3): via INTRAVENOUS

## 2018-07-12 MED ORDER — OXYTOCIN BOLUS FROM INFUSION: 500.0000 mL | Freq: Once | INTRAVENOUS | Status: DC

## 2018-07-12 MED ORDER — LIDOCAINE HCL (PF) 1 % IJ SOLN: 30.0000 mL | INTRAMUSCULAR | Status: DC | PRN

## 2018-07-12 MED ORDER — POLYSACCHARIDE IRON COMPLEX 150 MG PO CAPS
150.0000 mg | ORAL_CAPSULE | Freq: Every day | ORAL | Status: DC
  Administered 2018-07-12 – 2018-07-17 (×6): 150 mg via ORAL
  Filled 2018-07-12 (×6): qty 1

## 2018-07-12 MED ORDER — OXYCODONE-ACETAMINOPHEN 5-325 MG PO TABS: 2.0000 | ORAL_TABLET | ORAL | Status: DC | PRN

## 2018-07-12 MED ORDER — DOCUSATE SODIUM 100 MG PO CAPS
100.0000 mg | ORAL_CAPSULE | Freq: Two times a day (BID) | ORAL | Status: DC
  Administered 2018-07-12 – 2018-07-17 (×11): 100 mg via ORAL
  Filled 2018-07-12 (×12): qty 1

## 2018-07-12 MED ORDER — SOD CITRATE-CITRIC ACID 500-334 MG/5ML PO SOLN: 30.0000 mL | ORAL | Status: DC | PRN

## 2018-07-12 MED ORDER — LACTATED RINGERS IV SOLN: 500.0000 mL | INTRAVENOUS | Status: DC | PRN

## 2018-07-12 NOTE — Progress Notes (Signed)
Ante Progress Note: Pt just transferred from women's to [facility] and placed on birthing suites for monitoring.   Per HPI: Bridget Summers is a 33 y.o. female, G3P1011 at 32.2 weeks, presenting for bleeding. She noticed around 0200 vaginal bleeding while urinating. No recent intercourse. She got up and noticed a burst of discharge and then noticed her underwear were wetwith dark blood. The bleeding was going into the toilet and then she saw a clot.Called the on-call and was told to come in.  Subjective: Pt transferred in stable condition in NAD. Pt resting bed and aware of plan of care. Pt denies any question and we will continue to monitor pt. Pt endorses +FM, No leakage of fluid or vaginal bleeding since first bout. Denies abdominal pain, no cp or sob, pt stable  Patient Active Problem List   Diagnosis Date Noted  . Vaginal bleeding in pregnancy, third trimester 07/12/2018  . General counseling for prescription of oral contraceptives 07/23/2012  . Chlamydia trachomatis infection of genitourinary site 07/23/2012  . Other and unspecified ovarian cyst 07/23/2012   Objective: BP 124/86   Pulse 92   Temp 98.2 F (36.8 C) (Oral)   Resp 16   Ht 5\' 5"  (1.651 m)   Wt 110.2 kg   LMP 11/28/2017   SpO2 100%   BMI 40.44 kg/m  No intake/output data recorded. No intake/output data recorded.   Constitutes: AAOX3, NAD, obese CV: RRR, No murmurs Lungs: CTA bi-lat Abdomen: Soft non-tender, gravida slightly greater then gestational age. Not rigid, no pain upon palpation. GU: No vaginal bleeding noted, when abdominal exam performed no blood was expressed via the vagina.   NST: FHR baseline 150 bpm, Variability: moderate, Accelerations:present, Decelerations:  Absent= Cat 1/Reactive CTX:  irregular, every 3 in last 10 mins, uterus irritable, lasting 40-70 seconds, maternal perception is no cxt occuring Uterus gravid, soft non tender, soft to palpate with contractions.  Last vaginal check was by N.  P. CNM @ 0645  SVE:  Dilation: 1 Effacement (%): Thick Exam by:: N Prothero CNM  Recent Results (from the past 2160 hour(s))  CBC     Status: Abnormal   Collection Time: 07/12/18  4:52 AM  Result Value Ref Range   WBC 9.9 4.0 - 10.5 K/uL   RBC 4.41 3.87 - 5.11 MIL/uL   Hemoglobin 9.2 (L) 12.0 - 15.0 g/dL   HCT 06.2 (L) 69.4 - 85.4 %   MCV 68.0 (L) 80.0 - 100.0 fL   MCH 20.9 (L) 26.0 - 34.0 pg   MCHC 30.7 30.0 - 36.0 g/dL   RDW 62.7 (H) 03.5 - 00.9 %   Platelets 207 150 - 400 K/uL    Comment: SPECIMEN CHECKED FOR CLOTS REPEATED TO VERIFY    nRBC 0.0 0.0 - 0.2 %    Comment: Performed at [facility], [address] [place], [place] [address]  Type and screen [facility]     Status: None   Collection Time: 07/12/18  4:52 AM  Result Value Ref Range   ABO/RH(D) A POS    Antibody Screen NEG    Sample Expiration      07/15/2018 Performed at [facility], [address] [place], [place] [address]   Wet prep, genital     Status: Abnormal   Collection Time: 07/12/18  4:57 AM  Result Value Ref Range   Yeast Wet Prep HPF POC NONE SEEN NONE SEEN   Trich, Wet Prep NONE SEEN NONE SEEN   Clue  Cells Wet Prep HPF POC NONE SEEN NONE SEEN   WBC, Wet Prep HPF POC MODERATE (A) NONE SEEN    Comment: MANY BACTERIA SEEN   Sperm NONE SEEN     Comment: Performed at [facility], [address] [place], [place] [address]  Urine rapid drug screen (hosp performed)     Status: None   Collection Time: 07/12/18  5:34 AM  Result Value Ref Range   Opiates NONE DETECTED NONE DETECTED   Cocaine NONE DETECTED NONE DETECTED   Benzodiazepines NONE DETECTED NONE DETECTED   Amphetamines NONE DETECTED NONE DETECTED   Tetrahydrocannabinol NONE DETECTED NONE DETECTED   Barbiturates NONE DETECTED NONE DETECTED    Comment: (NOTE) DRUG SCREEN FOR MEDICAL PURPOSES ONLY.  IF CONFIRMATION IS NEEDED FOR ANY PURPOSE, NOTIFY LAB WITHIN 5 DAYS. LOWEST DETECTABLE LIMITS FOR URINE  DRUG SCREEN Drug Class                     Cutoff (ng/mL) Amphetamine and metabolites    1000 Barbiturate and metabolites    200 Benzodiazepine                 200 Tricyclics and metabolites     300 Opiates and metabolites        300 Cocaine and metabolites        300 THC                            50 Performed at [facility], [address] [place], [place] [address]   Type and screen [facility]     Status: None (Preliminary result)   Collection Time: 07/12/18  8:17 AM  Result Value Ref Range   ABO/RH(D) PENDING    Antibody Screen PENDING    Sample Expiration 07/15/2018    US Results.  No results found.   Assessment:  Bridget Summers, 32 y.o., [id number], with an IUP @ 8727w2d, presented for admission to [facility] birthing suit for observation and evaluation for vaginal bleeding. 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 Cat 1 strip , reactive NST, pending GBS, GC/C. Pt does not feels cxt currently, uterus irritable and irregular cxt noted, no vaginal bleeding now. Pt stable. Starting HGB 9.2, redraw performed for CBC now per Dr Normand Sloopillard. Plat 207. Wet prep show moderate WBC , no BV, trich, yeast or clue cells noted. UDS resulted negative. Blood type A+, no antibioties detected. First BMZ given @ 0530 on 07/12/2018. Preliminary results for US were, cephalic, anterior placenta, AFI 15.5cm, and no abruption or previa noted.  Patient Active Problem List   Diagnosis Date Noted  . Vaginal bleeding in pregnancy, third trimester 07/12/2018  . General counseling for prescription of oral contraceptives 07/23/2012  . Chlamydia trachomatis infection of genitourinary site 07/23/2012  . Other and unspecified ovarian cyst 07/23/2012   Plan: Vaginal Bleeding: Continue to monitor, if Bleeding occurs or pt feeling cxts, will check for cervical change, and call Dr Normand Sloopillard. May start pt on magnesium sulfate if bleeding stable and pt is laboring. CBC pending for four hours post  first draw. Will clarify with Dr Normand Sloopillard about wanting or needing serial CBCs.   GBS: PCR pending  Fetal wellbeing: BMZ x2 due @ 0530 on 07/12/2018 unless pt actively laboring then pt may receive second dose at 1730 tonight. MFM consult for 02/24 with growth scan US.   Md Dillard aware of plan and  verbalized agreement.   Dale Olean, NP-C, CNM, MSN 07/12/2018. 9:50 AM

## 2018-07-12 NOTE — MAU Note (Signed)
Went to BR and urinated. Then felt a pop and felt vag d/c. Thought it was d/c but looked and was blood. Saw a small clot come out. No intercourse recently. Mild cramping.

## 2018-07-12 NOTE — MAU Provider Note (Signed)
History     CSN: [id number]  Arrival date and time: 07/12/18 7846   None     Chief Complaint  Patient presents with  . Vaginal Bleeding  . Abdominal Pain   HPI   Ms.Bridget Summers is a 33 y.o. female G3P1011 @ [redacted]w[redacted]d here in MAU with vaginal bleeding. She noticed around 0200 vaginal bleeding while urinating. No recent intercourse. She got up and noticed a burst of discharge and then noticed her underwear were wet with dark blood. The bleeding was going into the toilet and then she saw a clot.  Called the on-call and was told to come in.   OB History    Gravida  3   Para  1   Term  1   Preterm      AB  1   Living  1     SAB      TAB  1   Ectopic      Multiple      Live Births  1           Past Medical History:  Diagnosis Date  . Ovarian cyst     History reviewed. No pertinent surgical history.  Family History  Problem Relation Age of Onset  . Diabetes Paternal Grandfather   . Healthy Mother   . Lupus Father     Social History   Tobacco Use  . Smoking status: Never Smoker  . Smokeless tobacco: Never Used  Substance Use Topics  . Alcohol use: Yes    Comment: mosly wine on weekends  . Drug use: No    Allergies: No Known Allergies  Medications Prior to Admission  Medication Sig Dispense Refill Last Dose  . prenatal vitamin w/FE, FA (NATACHEW) 29-1 MG CHEW chewable tablet Chew 1 tablet by mouth daily at 12 noon.   07/11/2018 at Unknown time  . metroNIDAZOLE (FLAGYL) 500 MG tablet Take 1 tablet (500 mg total) by mouth 2 (two) times daily. 14 tablet 0 More than a month at Unknown time  . terconazole (TERAZOL 7) 0.4 % vaginal cream Place 1 applicator vaginally at bedtime. 45 g 0 More than a month at Unknown time   No results found for this or any previous visit (from the past 48 hour(s)).  Review of Systems  Gastrointestinal: Negative for abdominal pain.  Genitourinary: Positive for vaginal bleeding.   Physical Exam   Blood pressure  113/75, pulse 89, temperature 98.1 F (36.7 C), resp. rate 18, height 5\' 5"  (1.651 m), weight 110.2 kg, last menstrual period 11/28/2017.  Physical Exam  Constitutional: She is oriented to person, place, and time. She appears well-developed and well-nourished. No distress.  HENT:  Head: Normocephalic.  Respiratory: Effort normal.  GI: Soft. She exhibits no distension. There is no abdominal tenderness. There is no rebound.  Genitourinary:    Genitourinary Comments: Vagina - Small- moderate amount of particulate dark red blood in the vault.  Cervix - Small amount of dark thick blood oozing from os.  Bimanual exam: Cervix closed, soft  GC/Chlam, wet prep done Chaperone present for exam.    Musculoskeletal: Normal range of motion.  Neurological: She is alert and oriented to person, place, and time.  Skin: Skin is warm. She is not diaphoretic.  Psychiatric: Her behavior is normal.   Fetal Tracing: Baseline: 145 bpm Variability: Moderate  Accelerations: 15x15 Decelerations: None Toco: occasional, irregular pattern   MAU Course  Procedures  None  MDM  US shows anterior placenta without  signs of abruption A positive blood type Discussed patient with Dr. Normand Sloop, will admit to labor and delivery per Dr. Normand Sloop   Assessment and Plan   A:  1. [redacted] weeks gestation of pregnancy   2. Vaginal bleeding in pregnancy, third trimester     P:  Bleeding concerning for abruption  Admit to labor and delivery  GBS, wet prep, and GC collected  Duane Lope, NP 07/12/2018 4:56 AM

## 2018-07-12 NOTE — Progress Notes (Signed)
intermittently tracing maternal while pt sitting to eat & up to bathroom.  Pt repositioned & monitors adjusted

## 2018-07-12 NOTE — Progress Notes (Signed)
Pt without complaint.  Bleeding has stopped.  No pain BP (!) 104/58   Pulse (!) 111   Temp 98.4 F (36.9 C) (Oral)   Resp 16   Ht 5\' 5"  (1.651 m)   Wt 110.2 kg   LMP 11/28/2017   SpO2 100%   BMI 40.44 kg/m   Cat 1 NST toco no contractions Abd soft NT gravid GU dark discharge EXT No calf tenderness B Third trimester bleeding c/w abruption Bleeding has stopped Continuous monitoring Iron for anemia Colace SCD hose MFM [place] and consult in AM may show signs of abruption will also check fetal growth

## 2018-07-12 NOTE — H&P (Signed)
Bridget Summers is a 33 y.o. female, G3P1011 at 32.2 weeks, presenting for bleeding. She noticed around 0200 vaginal bleeding while urinating. No recent intercourse. She got up and noticed a burst of discharge and then noticed her underwear were wet with dark blood. The bleeding was going into the toilet and then she saw a clot.  Called the on-call and was told to come in.   Patient Active Problem List   Diagnosis Date Noted  . General counseling for prescription of oral contraceptives 07/23/2012  . Chlamydia trachomatis infection of genitourinary site 07/23/2012  . Other and unspecified ovarian cyst 07/23/2012    History of present pregnancy: Patient entered care at 11.4 weeks.   EDC of 09/04/2018  was established by LMP.   Anatomy scan:  20 weeks, with normal findings and a lateral  Placenta with an accessory lobe Additional [place] evaluations:   Significant prenatal events:     OB History    Gravida  3   Para  1   Term  1   Preterm      AB  1   Living  1     SAB      TAB  1   Ectopic      Multiple      Live Births  1          Past Medical History:  Diagnosis Date  . Ovarian cyst    History reviewed. No pertinent surgical history. Family History: family history includes Diabetes in her paternal grandfather; Healthy in her mother; Lupus in her father. Social History:  reports that she has never smoked. She has never used smokeless tobacco. She reports current alcohol use. She reports that she does not use drugs.   Prenatal Transfer Tool  Maternal Diabetes: No Genetic Screening: Declined Maternal Ultrasounds/Referrals: Normal Fetal Ultrasounds or other Referrals:  None Maternal Substance Abuse:  No Significant Maternal Medications:  None Significant Maternal Lab Results: None  TDAP Neg Flu Neg  ROS:  All 10 systems reviewed and neg except stated above  No Known Allergies   Dilation: Closed Exam by:: Venia Carbon NP Blood pressure 113/75, pulse  89, temperature 98.1 F (36.7 C), resp. rate 18, height 5\' 5"  (1.651 m), weight 110.2 kg, last menstrual period 11/28/2017.  Chest clear Heart RRR without murmur Abd gravid, NT, FH appropriate Pelvic: Per NP in MAU Ext: Neg  FHR: Category 1 UCs:    Prenatal labs: ABO, Rh: --/--/A POS Performed at [facility], [address] [place], [place] [address]  [phone]) Antibody:  NR Rubella:   IMM RPR:   NR HBsAg:   Neg HIV:   NR GBS:  Pending Sickle cell/Hgb electrophoresis:  AA Pap:   GC:  Neg Chlamydia:  Neg Genetic screenings:   Glucola:  135 Other:   Hgb , 9.1 at 28 weeks       Assessment/Plan: IUP at 32.2 with vaginal bleeding Cat 1 strip  Plan: Admit to Birthing Suite per consult with Dr. Normand Sloop Routine [facility] orders Betamethasone Serial CBCs GBS Continuous monitoring Recheck at 0730 pt stable, cat 1 strip  Henderson Newcomer ProtheroCNM, MSN 07/12/2018, 5:30 AM

## 2018-07-12 NOTE — Plan of Care (Signed)
  Problem: Coping: Goal: Ability to verbalize concerns and feelings about labor and delivery will improve Outcome: Progressing   Problem: Education: Goal: Knowledge of Childbirth will improve Outcome: Progressing

## 2018-07-13 ENCOUNTER — Inpatient Hospital Stay (HOSPITAL_COMMUNITY): Payer: Medicaid Other

## 2018-07-13 DIAGNOSIS — Z3A32 32 weeks gestation of pregnancy: Secondary | ICD-10-CM

## 2018-07-13 DIAGNOSIS — O4693 Antepartum hemorrhage, unspecified, third trimester: Secondary | ICD-10-CM

## 2018-07-13 DIAGNOSIS — Z363 Encounter for antenatal screening for malformations: Secondary | ICD-10-CM

## 2018-07-13 LAB — GC/CHLAMYDIA PROBE AMP (~~LOC~~) NOT AT ARMC
Chlamydia: NEGATIVE
Neisseria Gonorrhea: NEGATIVE

## 2018-07-13 MED ORDER — DOCUSATE SODIUM 100 MG PO CAPS: 100.0000 mg | ORAL_CAPSULE | Freq: Every day | ORAL | Status: DC

## 2018-07-13 MED ORDER — SODIUM CHLORIDE 0.9 % IV SOLN: 250.0000 mL | INTRAVENOUS | Status: DC | PRN

## 2018-07-13 MED ORDER — ACETAMINOPHEN 325 MG PO TABS
650.0000 mg | ORAL_TABLET | ORAL | Status: DC | PRN
  Administered 2018-07-16: 650 mg via ORAL
  Filled 2018-07-13: qty 2

## 2018-07-13 MED ORDER — PRENATAL MULTIVITAMIN CH
1.0000 | ORAL_TABLET | Freq: Every day | ORAL | Status: DC
  Administered 2018-07-13 – 2018-07-16 (×3): 1 via ORAL
  Filled 2018-07-13 (×3): qty 1

## 2018-07-13 MED ORDER — ZOLPIDEM TARTRATE 5 MG PO TABS: 5.0000 mg | ORAL_TABLET | Freq: Every evening | ORAL | Status: DC | PRN

## 2018-07-13 MED ORDER — SODIUM CHLORIDE 0.9% FLUSH
3.0000 mL | Freq: Two times a day (BID) | INTRAVENOUS | Status: DC
  Administered 2018-07-13 – 2018-07-17 (×7): 3 mL via INTRAVENOUS

## 2018-07-13 MED ORDER — CALCIUM CARBONATE ANTACID 500 MG PO CHEW: 2.0000 | CHEWABLE_TABLET | ORAL | Status: DC | PRN

## 2018-07-13 MED ORDER — SODIUM CHLORIDE 0.9% FLUSH: 3.0000 mL | INTRAVENOUS | Status: DC | PRN

## 2018-07-13 NOTE — Progress Notes (Signed)
Patient to be transferred to [facility] high risk.  Orders noted.  OB high risk notified.

## 2018-07-13 NOTE — Progress Notes (Addendum)
Bridget Summers is a 33 y.o.  G3P1 patient at [redacted]w[redacted]d.   1. [redacted] weeks gestation of pregnancy   2. Vaginal bleeding in pregnancy, third trimester   3. Vaginal bleeding     Past Medical History:  Diagnosis Date  . Ovarian cyst     Current Facility-Administered Medications  Medication Dose Route Frequency Provider Last Rate Last Dose  . acetaminophen (TYLENOL) tablet 650 mg  650 mg Oral Q4H PRN Rasch, Victorino Dike I, NP      . docusate sodium (COLACE) capsule 100 mg  100 mg Oral BID Jaymes Graff, MD   100 mg at 07/12/18 2218  . iron polysaccharides (NIFEREX) capsule 150 mg  150 mg Oral Daily Dillard, Naima, MD   150 mg at 07/12/18 1345  . lactated ringers infusion 500-1,000 mL  500-1,000 mL Intravenous PRN Rasch, Victorino Dike I, NP      . lactated ringers infusion   Intravenous Continuous Rasch, Victorino Dike I, NP 125 mL/hr at 07/12/18 0748    . lidocaine (PF) (XYLOCAINE) 1 % injection 30 mL  30 mL Subcutaneous PRN Rasch, Victorino Dike I, NP      . ondansetron (ZOFRAN) injection 4 mg  4 mg Intravenous Q6H PRN Rasch, Victorino Dike I, NP      . oxyCODONE-acetaminophen (PERCOCET/ROXICET) 5-325 MG per tablet 1 tablet  1 tablet Oral Q4H PRN Rasch, Victorino Dike I, NP      . oxyCODONE-acetaminophen (PERCOCET/ROXICET) 5-325 MG per tablet 2 tablet  2 tablet Oral Q4H PRN Rasch, Victorino Dike I, NP      . oxytocin (PITOCIN) IV BOLUS FROM BAG  500 mL Intravenous Once Rasch, Victorino Dike I, NP      . oxytocin (PITOCIN) IV infusion 40 units in NS 1000 mL - Premix  2.5 Units/hr Intravenous Continuous Rasch, Victorino Dike I, NP      . sodium citrate-citric acid (ORACIT) solution 30 mL  30 mL Oral Q2H PRN Rasch, Victorino Dike I, NP       No Known Allergies Active Problems:   Vaginal bleeding in pregnancy, third trimester  Subjective: Patient reports she has not felt a contraction since last night. She denies abdominal pain. She reports dark brownish spotting when she voids but it does not happen when she is not using the restroom. She is not  wearing a pad. She is feeling good fetal movement.  Objective:  Vitals:   07/13/18 0517 07/13/18 0700 07/13/18 0800 07/13/18 0845  BP: 126/75 115/62 120/60 123/69  Pulse: 94 96 90 (!) 107  Resp: 16 16 16 16   Temp:  98.6 F (37 C) 98.5 F (36.9 C)   TempSrc:  Oral Oral   SpO2:      Weight:      Height:       No results found for this or any previous visit (from the past 24 hour(s)).   Physical Exam  Constitutional: She is oriented to person, place, and time. She appears well-developed and well-nourished.  HENT:  Head: Normocephalic and atraumatic.  Eyes: Pupils are equal, round, and reactive to light.  Pulmonary/Chest: Effort normal. No respiratory distress.  Musculoskeletal: Normal range of motion.  Neurological: She is alert and oriented to person, place, and time.  Skin: Skin is warm and dry.  Psychiatric: She has a normal mood and affect. Her behavior is normal. Judgment and thought content normal.  Vitals reviewed.  FHTs: 140s, moderate beat to beat variability, +accels, - decels. Reactive category 1 FHTs.   Assessment/plan: 33 y.o. G3P1 at [redacted]w[redacted]d 3rd trimester vaginal  bleeding, possible abruption Reactive NST, category 1 FHTs No uterine contractions Ultrasound this morning MFM consult pending Consider transferring patient to high risk antepartum  Janeece Riggers 07/13/2018   I saw the pt this morning at approximately 10:30 am.  Pt only reports brown discharge with wiping.  Will transfer to [facility] High Risk unit.  MFM saw pt and the following recs given:  Recommendations: -Consider discharge 24 to 48 hours after complete cessation of bleeding. -Complete betamethasone course. -Perform sterile-speculum examination to rule out local causes. -Iron supplements. -Iron studies. -GDM screening 7 to 10 days after completion of betamethasone course.  U/S today - 4lbs 14oz, 76%, nl fluid, vtx. GC/CT pending GBS neg Speculum exam was performed by MAU NP upon initial eval.

## 2018-07-13 NOTE — Progress Notes (Signed)
Pt was off of continuous monitoring from 2057 to 2145 due to an equipment malfunction. Equipment is working now. Will continue to monitor.

## 2018-07-13 NOTE — Progress Notes (Signed)
Patient sitting up eating.  I removed her from EFM.

## 2018-07-13 NOTE — Progress Notes (Signed)
Pt refusing monitoring to take a shower. Will reapply monitors when done.

## 2018-07-13 NOTE — Progress Notes (Signed)
Patient to US via WC.

## 2018-07-13 NOTE — Progress Notes (Signed)
Patient back from [place].  Monitors reapplied.  No complaints at this time.

## 2018-07-13 NOTE — Consult Note (Signed)
Maternal-Fetal Medicine Name: Bridget Summers MRN: [id number] Requesting Provider: Kathalene Summers, CNM  Bridget Summers, [name] at 32w 3d gestation, was admitted yesterday with vaginal bleeding. Patient noticed vaginal bleeding while urinating. It had stopped now and she has only brown discharge. She did not have abdominal pain or uterine contractions. She denies recent history of sexual intercourse.  Her prenatal course has, otherwise, been uneventful. Mid-trimester fetal anatomy was normal. She had opted not to screen for fetal aneuploidies. Early screening ruled out gestational diabetes and she will be undergoing screening in the third trimester now.  PMH: Anemia in pregnancy. Patient is not taking iron supplements. No history of hypertension or diabetes or any other chronic medical conditions. PSH: Nil of note. Meds: Prenatal vitamins. Allergies: NKDA. Social: Denies tobacco or drug or alcohol use. Patient's partner (not the father of her first baby) is an Tree surgeon and he is in good health. Family: No history of venous thromboembolism in the family. Gyn history: History of abnormal Pap smears in the past and cryosurgery. No history of breast disease. Obstetric history is significant for a term vaginal delivery in 2009 of a female infant weighing 5+ lbs at birth. Her pregnancy was uncomplicated. P/E: Patient is comfortably lying in bed; not in distress. Vitals stable HEENT: Normal; no lymphadenopathy. Abd: Soft gravid uterus; no tenderness. No pedal edema. NST is reactive.  Labs: Hb 8.3, Hct 27.4, PLT 200, WBC 11.1. Ultrasound: Fetal growth is appropriate for gestational age. Amniotic fluid is normal and good fetal activity is seen. Fetal anatomy appears normal, but limited by advanced gestational age. No evidence of placenta previa or abruption. Ultrasound has limitations in diagnosing placental abruption.  I counseled the patient on the following:  Vaginal bleeding: I discussed  the possible causes of vaginal bleeding and reassured her that she does not have placenta previa. Other causes include placental abruption or local causes (cervical or vaginal lesions). It is reassuring that the patient has stopped bleeding. In the absence of local causes, she would be managed with the diagnosis of placental abruption. If complete cessation of bleeding occurs, I recommend she be observed for 24 to 48 hours before discharging home. I informed the patient that longer period of hospitalization (possibly till delivery) may be recommended if has recurrent bleeding. Timing of delivery would be decided based on her symptoms.  Anemia: Most-likely cause is iron-deficiency anemia. I recommended iron supplements. Pregnancy and vaginal bleeding can lead to severe anemia and blood transfusion may be required at delivery. If patient cannot tolerate oral iron, intravenous iron transfusion maybe considered. Iron studies may be performed.  Recommendations: -Consider discharge 24 to 48 hours after complete cessation of bleeding. -Complete betamethasone course. -Perform sterile-speculum examination to rule out local causes. -Iron supplements. -Iron studies. -GDM screening 7 to 10 days after completion of betamethasone course.  Thank you for consultation. Face-to-face counseling 40 minutes.

## 2018-07-13 NOTE — Anesthesia Pain Management Evaluation Note (Signed)
  [facility] Pain Management Visit Note  Patient: RANIKA HAMBLEY, 33 y.o., female  "Hello I am a member of the anesthesia team at [facility]. We have an anesthesia team available at all times to provide care throughout the hospital, including epidural management and anesthesia for C-section. I don't know your plan for the delivery whether it a natural birth, water birth, IV sedation, nitrous supplementation, doula or epidural, but we want to meet your pain goals."   1.Was your pain managed to your expectations on prior hospitalizations?   Yes   2.What is your expectation for pain management during this hospitalization?     Epidural  3.How can we help you reach that goal? Place epidural at pain goal.  Record the patient's initial score and the patient's pain goal.   Pain: 0  Pain Goal: 8 [facility] wants you to be able to say your pain was always managed very well.  Donzella Carrol 07/13/2018

## 2018-07-14 LAB — CBC WITH DIFFERENTIAL/PLATELET
Abs Immature Granulocytes: 0.07 K/uL (ref 0.00–0.07)
Basophils Absolute: 0 K/uL (ref 0.0–0.1)
Basophils Relative: 0 %
Eosinophils Absolute: 0 K/uL (ref 0.0–0.5)
Eosinophils Relative: 0 %
HCT: 26.5 % — ABNORMAL LOW (ref 36.0–46.0)
Hemoglobin: 8.1 g/dL — ABNORMAL LOW (ref 12.0–15.0)
Immature Granulocytes: 1 %
Lymphocytes Relative: 23 %
Lymphs Abs: 2.2 K/uL (ref 0.7–4.0)
MCH: 20.9 pg — ABNORMAL LOW (ref 26.0–34.0)
MCHC: 30.6 g/dL (ref 30.0–36.0)
MCV: 68.3 fL — ABNORMAL LOW (ref 80.0–100.0)
Monocytes Absolute: 0.8 K/uL (ref 0.1–1.0)
Monocytes Relative: 8 %
Neutro Abs: 6.5 K/uL (ref 1.7–7.7)
Neutrophils Relative %: 68 %
Platelets: 215 K/uL (ref 150–400)
RBC: 3.88 MIL/uL (ref 3.87–5.11)
RDW: 16.3 % — ABNORMAL HIGH (ref 11.5–15.5)
WBC: 9.6 K/uL (ref 4.0–10.5)
nRBC: 0 % (ref 0.0–0.2)

## 2018-07-14 LAB — COMPREHENSIVE METABOLIC PANEL
ALT: 16 U/L (ref 0–44)
AST: 16 U/L (ref 15–41)
Albumin: 2.6 g/dL — ABNORMAL LOW (ref 3.5–5.0)
Alkaline Phosphatase: 52 U/L (ref 38–126)
Anion gap: 6 (ref 5–15)
BUN: 7 mg/dL (ref 6–20)
CO2: 21 mmol/L — ABNORMAL LOW (ref 22–32)
CREATININE: 0.62 mg/dL (ref 0.44–1.00)
Calcium: 8.3 mg/dL — ABNORMAL LOW (ref 8.9–10.3)
Chloride: 110 mmol/L (ref 98–111)
GFR calc Af Amer: >60 mL/min (ref >60)
GFR calc non Af Amer: >60 mL/min (ref >60)
Glucose, Bld: 116 mg/dL — ABNORMAL HIGH (ref 70–99)
Potassium: 2.7 mmol/L — CL (ref 3.5–5.1)
Sodium: 137 mmol/L (ref 135–145)
Total Bilirubin: 0.1 mg/dL — ABNORMAL LOW (ref 0.3–1.2)
Total Protein: 5.6 g/dL — ABNORMAL LOW (ref 6.5–8.1)

## 2018-07-14 LAB — PROTEIN / CREATININE RATIO, URINE
Creatinine, Urine: 285.07 mg/dL
Protein Creatinine Ratio: 0.1 mg/mg{creat} (ref 0.00–0.15)
Total Protein, Urine: 29 mg/dL

## 2018-07-14 MED ORDER — POTASSIUM CHLORIDE CRYS ER 20 MEQ PO TBCR
20.0000 meq | EXTENDED_RELEASE_TABLET | Freq: Two times a day (BID) | ORAL | Status: DC
  Administered 2018-07-14: 20 meq via ORAL
  Filled 2018-07-14: qty 1

## 2018-07-14 NOTE — Progress Notes (Signed)
CRITICAL VALUE ALERT  Critical Value:  Potassium 2.7  Date & Time Notied:  07/14/2018 2039  Provider Notified: Bernerd Pho, CNM  Orders Received/Actions taken: Order for oral K dur  Arva Chafe, RN

## 2018-07-14 NOTE — Progress Notes (Signed)
Called with critical potassium level of 2.7  Will treat with 40 mg BID x 3 doses.

## 2018-07-14 NOTE — Progress Notes (Signed)
S: Pt stable. Reviewed pt VS over last 24 hour which review three elevated BP of 140/80s 4 hours apart.   O: BP 140/69 (BP Location: Left Arm)   Pulse (!) 101   Temp 98.3 F (36.8 C) (Oral)   Resp 20   Ht 5\' 5"  (1.651 m)   Wt 110.2 kg   LMP 11/28/2017   SpO2 98%   BMI 40.44 kg/m   A/P Per Up to date Dx of GHTN consist of the following below: DEFINITION/DIAGNOSIS-Gestational hypertension is a clinical diagnosis defined by the new onset of hypertension (defined as systolic blood pressure ?140 mmHg and/or diastolic blood pressure ?90 mmHg) at ?20 weeks of gestation in the absence of proteinuria or new signs of end-organ dysfunction [1]. The blood pressure readings should be documented on at least two occasions at least four hours apart.  Drew CMP, CBC, and PCR for baseline labs and/or to rule out PreE. Reported off the [facility] CNM, And Dr Mora Appl.

## 2018-07-14 NOTE — Progress Notes (Addendum)
LOS # 2  Bridget Summers is a 33 y.o. female, G3P1011 at 32.2 weeks, presenting for bleeding. She noticed around 0200 vaginal bleeding while urinating. No recent intercourse. She got up and noticed a burst of discharge and then noticed her underwear were wetwith dark blood. The bleeding was going into the toilet and then she saw a clot.Called the on-call and was told to come in.  Subjective: Pt inn ggod spirits in Bed in NAD, resting watching TV. Pt endorses still having brown discharge from the vagina mostly when taking a shower or wiping after using the bathroom, pt endorses +FM, denies leakage of fluids. Pt denies HA, vision changes, no RUQ pain, no cp, sob, abdominal pain.  Patient Active Problem List   Diagnosis Date Noted  . Vaginal bleeding in pregnancy, third trimester 07/12/2018  . General counseling for prescription of oral contraceptives 07/23/2012  . Chlamydia trachomatis infection of genitourinary site 07/23/2012  . Other and unspecified ovarian cyst 07/23/2012   Objective: BP 124/73 (BP Location: Right Arm)   Pulse 99   Temp 98.4 F (36.9 C) (Axillary)   Resp 18   Ht  (1.651 m)   Wt 110.2 kg   LMP 11/28/2017   SpO2 98%   BMI 40.44 kg/m  I/O last 3 completed shifts: In: 240 [P.O.:240] Out: -  No intake/output data recorded.   Constitutes: AAOX3, NAD, obese CV: RRR, No murmurs Lungs: CTA bi-lat Abdomen: Soft non-tender, gravida slightly greater then gestational age. Not rigid, no pain upon palpation. GU: No vaginal bleeding noted, when abdominal exam performed no blood was expressed via the vagina. Small brown streak noted on pad.    NST: FHR baseline 150 bpm, Variability: moderate, Accelerations:present, Decelerations:  Absent= Cat 1/Reactive CTX: None Uterus gravid, soft non tender, soft to palpate with contractions.  Last vaginal check was by N. P. CNM @ 0645 on 02/23 SVE:  Dilation: 1 Effacement (%): Thick Exam by:: N Prothero  CNM  Recent Results (from the past 2160 hour(s))  GC/Chlamydia probe amp ([place])not at [facility]     Status: None   Collection Time: 07/12/18 12:00 AM  Result Value Ref Range   Chlamydia Negative     Comment: Normal Reference Range - Negative   Neisseria gonorrhea Negative     Comment: Normal Reference Range - Negative  CBC     Status: Abnormal   Collection Time: 07/12/18  4:52 AM  Result Value Ref Range   WBC 9.9 4.0 - 10.5 K/uL   RBC 4.41 3.87 - 5.11 MIL/uL   Hemoglobin 9.2 (L) 12.0 - 15.0 g/dL   HCT 40.9 (L) 81.1 - 91.4 %   MCV 68.0 (L) 80.0 - 100.0 fL   MCH 20.9 (L) 26.0 - 34.0 pg   MCHC 30.7 30.0 - 36.0 g/dL   RDW 78.2 (H) 95.6 - 21.3 %   Platelets 207 150 - 400 K/uL    Comment: SPECIMEN CHECKED FOR CLOTS REPEATED TO VERIFY    nRBC 0.0 0.0 - 0.2 %    Comment: Performed at [facility], [address], [place], [place] [address]  Type and screen [facility] [place] [facility]     Status: None   Collection Time: 07/12/18  4:52 AM  Result Value Ref Range   ABO/RH(D) A POS    Antibody Screen NEG    Sample Expiration      07/15/2018 Performed at [facility], [address] [place], [place] [address]   RPR  Status: None   Collection Time: 07/12/18  4:52 AM  Result Value Ref Range   RPR Ser Ql Non Reactive Non Reactive    Comment: (NOTE) Performed At: [facility] [address] [place], [place] [id number] Jolene Schimke MD WU:[id number]   Group B strep by PCR     Status: None   Collection Time: 07/12/18  4:57 AM  Result Value Ref Range   Group B strep by PCR NEGATIVE NEGATIVE    Comment: (NOTE) Intrapartum testing with Xpert GBS assay should be used as an adjunct to other methods available and not used to replace antepartum testing (at 35-[redacted] weeks gestation). Performed at [facility], [address], [place], [place] [address]   Wet prep, genital     Status: Abnormal   Collection Time: 07/12/18  4:57 AM  Result Value Ref Range    Yeast Wet Prep HPF POC NONE SEEN NONE SEEN   Trich, Wet Prep NONE SEEN NONE SEEN   Clue Cells Wet Prep HPF POC NONE SEEN NONE SEEN   WBC, Wet Prep HPF POC MODERATE (A) NONE SEEN    Comment: MANY BACTERIA SEEN   Sperm NONE SEEN     Comment: Performed at [facility], [address], [place], [place] [address]  Urine rapid drug screen (hosp performed)     Status: None   Collection Time: 07/12/18  5:34 AM  Result Value Ref Range   Opiates NONE DETECTED NONE DETECTED   Cocaine NONE DETECTED NONE DETECTED   Benzodiazepines NONE DETECTED NONE DETECTED   Amphetamines NONE DETECTED NONE DETECTED   Tetrahydrocannabinol NONE DETECTED NONE DETECTED   Barbiturates NONE DETECTED NONE DETECTED    Comment: (NOTE) DRUG SCREEN FOR MEDICAL PURPOSES ONLY.  IF CONFIRMATION IS NEEDED FOR ANY PURPOSE, NOTIFY LAB WITHIN 5 DAYS. LOWEST DETECTABLE LIMITS FOR URINE DRUG SCREEN Drug Class                     Cutoff (ng/mL) Amphetamine and metabolites    1000 Barbiturate and metabolites    200 Benzodiazepine                 200 Tricyclics and metabolites     300 Opiates and metabolites        300 Cocaine and metabolites        300 THC                            50 Performed at [facility], [address], [place], [place] [address]   Type and screen [facility]     Status: None   Collection Time: 07/12/18  8:17 AM  Result Value Ref Range   ABO/RH(D) A POS    Antibody Screen NEG    Sample Expiration 07/15/2018   ABO/Rh     Status: None   Collection Time: 07/12/18  8:17 AM  Result Value Ref Range   ABO/RH(D)      A POS Performed at [facility], [address] [place], [place] [address]   CBC with Differential/Platelet     Status: Abnormal   Collection Time: 07/12/18  8:45 AM  Result Value Ref Range   WBC 11.1 (H) 4.0 - 10.5 K/uL   RBC 4.01 3.87 - 5.11 MIL/uL   Hemoglobin 8.3 (L) 12.0 - 15.0 g/dL    Comment: Reticulocyte Hemoglobin testing may be clinically  indicated, consider ordering this  additional test [id number]    HCT 27.4 (L) 36.0 - 46.0 %   MCV 68.3 (L) 80.0 - 100.0 fL   MCH 20.7 (L) 26.0 - 34.0 pg   MCHC 30.3 30.0 - 36.0 g/dL   RDW 54.0 (H) 98.1 - 19.1 %   Platelets 200 150 - 400 K/uL    Comment: REPEATED TO VERIFY   nRBC 0.0 0.0 - 0.2 %   Neutrophils Relative % 82 %   Neutro Abs 9.2 (H) 1.7 - 7.7 K/uL   Lymphocytes Relative 13 %   Lymphs Abs 1.4 0.7 - 4.0 K/uL   Monocytes Relative 4 %   Monocytes Absolute 0.4 0.1 - 1.0 K/uL   Eosinophils Relative 0 %   Eosinophils Absolute 0.0 0.0 - 0.5 K/uL   Basophils Relative 0 %   Basophils Absolute 0.0 0.0 - 0.1 K/uL   Immature Granulocytes 1 %   Abs Immature Granulocytes 0.05 0.00 - 0.07 K/uL    Comment: Performed at [facility], [address] [place], [place] [address]   [place] Results.  [place] Mfm Ob Detail +14 Wk  Result Date: 07/13/2018 ----------------------------------------------------------------------  OBSTETRICS REPORT                       (Signed Final 07/13/2018 12:07 pm) ---------------------------------------------------------------------- Patient Info  ID #:       [id number]                          D.O.B.:  12-Jul-1985 (32 yrs)  Name:       Bridget Summers                Visit Date: 07/13/2018 07:52 am ---------------------------------------------------------------------- Performed By  Performed By:     Hurman Horn          Ref. Address:     [address] [place],                                                             [place] [address]  Attending:        Noralee Space MD        Secondary Phy.:   MAU Nursing-                                                             MAU/Triage  Referred By:      Harolyn Rutherford             Location:         [facility] NP                                 [facility]  ---------------------------------------------------------------------- Orders   #  Description                          Code         Ordered By   1  [place] MFM OB DETAIL +14 WK              76811.01     Elis Rawlinson  ----------------------------------------------------------------------   #  Order #                    Accession #                 Episode #   1  [id number]                  [id number]                  [id number]  ---------------------------------------------------------------------- Indications   Encounter for antenatal screening for          Z36.3   malformations   Vaginal bleeding in pregnancy, third trimester O46.93   [redacted] weeks gestation of pregnancy                Z3A.32  ---------------------------------------------------------------------- Fetal Evaluation  Num Of Fetuses:         1  Fetal Heart Rate(bpm):  166  Cardiac Activity:       Observed  Presentation:           Cephalic  Placenta:               Right lateral  P. Cord Insertion:      Visualized  Amniotic Fluid  AFI FV:      Within normal limits  AFI Sum(cm)     %Tile       Largest Pocket(cm)  11.45           28          3.64  RUQ(cm)       RLQ(cm)       LUQ(cm)        LLQ(cm)  1.57          2.79          3.45           3.64  Comment:    No placental abruption or previa identified. ---------------------------------------------------------------------- Biometry  BPD:      79.6  mm     G. Age:  32w 0d         28  %    CI:        75.82   %    70 - 86                                                          FL/HC:      21.2   %    19.1 - 21.3  HC:      289.8  mm     G. Age:  31w 6d  8  %    HC/AC:      0.93        0.96 - 1.17  AC:      310.3  mm     G. Age:  35w 0d       > 97  %    FL/BPD:     77.1   %    71 - 87  FL:       61.4  mm     G. Age:  31w 6d         23  %    FL/AC:      19.8   %    20 - 24  CER:      44.1  mm     G. Age:  38w 1d       > 95  %  LV:        4.5  mm  Est. FW:    2210  gm    4 lb 14 oz      76  %  ---------------------------------------------------------------------- OB History  Gravidity:    3         Term:   1        Prem:   0        SAB:   0  TOP:          1       Ectopic:  0        Living: 1 ---------------------------------------------------------------------- Gestational Age  LMP:           32w 3d        Date:  11/28/17                 EDD:   09/04/18  U/S Today:     32w 5d                                        EDD:   09/02/18  Best:          Armida Sans 3d     Det. By:  LMP  (11/28/17)          EDD:   09/04/18 ---------------------------------------------------------------------- Anatomy  Cranium:               Appears normal         Aortic Arch:            Appears normal  Cavum:                 Appears normal         Ductal Arch:            Not well visualized  Ventricles:            Appears normal         Diaphragm:              Appears normal  Choroid Plexus:        Appears normal         Stomach:                Appears normal, left  sided  Cerebellum:            Appears normal         Abdomen:                Appears normal  Posterior Fossa:       Appears normal         Abdominal Wall:         Not well visualized  Nuchal Fold:           Not applicable (>20    Cord Vessels:           Not well visualized                         wks GA)  Face:                  Appears normal         Kidneys:                Appear normal                         (orbits and profile)  Lips:                  Appears normal         Bladder:                Appears normal  Thoracic:              Appears normal         Spine:                  Appears normal  Heart:                 Appears normal         Upper Extremities:      RT Nml; LT NWS                         (4CH, axis, and                         situs)  RVOT:                  Appears normal         Lower Extremities:      RT Nml; LT NWS  LVOT:                  Appears normal  Other:  Fetus appears to be a  female. Nasal bone visualized. Technically          difficult due to advanced GA and fetal position. ---------------------------------------------------------------------- Impression  Fetal growth is appropriate for gestational age. Amniotic fluid  is normal and good fetal activity is seen. Fetal anatomy  appears normal, but limited by advanced gestational age. No  evidence of placenta previa or abruption. Ultrasound has  limitations in diagnosing placental abruption.  See consult note in EPIC. ----------------------------------------------------------------------                  Noralee Space, MD Electronically Signed Final Report   07/13/2018 12:07 pm ----------------------------------------------------------------------  [place] Mfm Ob Limited  Result Date: 07/12/2018 ----------------------------------------------------------------------  OBSTETRICS REPORT                       (  Signed Final 07/12/2018 10:11 am) ---------------------------------------------------------------------- Patient Info  ID #:       [id number]                          D.O.B.:  1986/03/23 (32 yrs)  Name:       Bridget Summers                Visit Date: 07/12/2018 04:11 am ---------------------------------------------------------------------- Performed By  Performed By:     Earley Brooke     Ref. Address:      [address], [place]                                                              [address] [place],                                                              [place] [address]  Attending:        Noralee Space MD        Secondary Phy.:    MAU Nursing-                                                              MAU/Triage  Referred By:      Harolyn Rutherford             Location:          [facility]                    [facility] NP ---------------------------------------------------------------------- Orders   #  Description                           Code        Ordered By   1  [place] MFM OB LIMITED                     62130.86     JENNIFER [facility]  ----------------------------------------------------------------------   #  Order #                     Accession #                Episode #   1  [id number]                    [id number]                 [id number]  ---------------------------------------------------------------------- Indications   Vaginal bleeding in pregnancy, third            O46.93   trimester   [redacted] weeks gestation of pregnancy  Z3A.32   Abdominal pain in pregnancy (Mild               O99.89   cramping)  ---------------------------------------------------------------------- Fetal Evaluation  Num Of Fetuses:          1  Fetal Heart              158  Rate(bpm):  Cardiac Activity:        Observed  Presentation:            Cephalic  Placenta:                Anterior  P. Cord Insertion:       Not well visualized  Amniotic Fluid  AFI FV:      Within normal limits  AFI Sum(cm)     %Tile       Largest Pocket(cm)  15.49           55          5.93  RUQ(cm)       RLQ(cm)       LUQ(cm)        LLQ(cm)  5.93          2.41          2.5            4.65  Comment:    No placental abruption or previa identified sonographically. ---------------------------------------------------------------------- OB History  Gravidity:    3         Term:   1        Prem:   0         SAB:   0  TOP:          1       Ectopic:  0        Living: 1 ---------------------------------------------------------------------- Gestational Age  LMP:           32w 2d        Date:  11/28/17                 EDD:    09/04/18  Best:          Armida Sans 2d     Det. By:  LMP  (11/28/17)          EDD:    09/04/18 ---------------------------------------------------------------------- Cervix Uterus Adnexa  Cervix  Not visualized (advanced GA >24wks)  Left Ovary  Within normal limits.  Right Ovary  Within normal limits.  Adnexa  No abnormality visualized. ---------------------------------------------------------------------- Impression  Patient was evaluated for c/o vaginal bleeding.   A limited ultrasound study was performed. Amniotic fluid is  normal and good fetal activity is seen. No placenta previa or  abruption is seen. Ultrasound has limitations in diagnosing  placental abruption. ----------------------------------------------------------------------                  Noralee Space, MD Electronically Signed Final Report   07/12/2018 10:11 am ----------------------------------------------------------------------    Assessment: LOS#2 Bridget Summers, 32 y.o., [phone], with an IUP @ [redacted]w[redacted]d, presented for admission to [facility] for observation and evaluation for vaginal bleeding. 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 Cat 1 strip , reactive NST, pending GC/C. Pt does not feels cxt currently, no cxt showing on toco, pt stable resting in beds, no s/sx of anemia or abruptions. Continues to have brown discharge. Pt stable. Starting HGB 9.2, and dropped to 8.3 most likely cause is iron deficiency anemia .  Plat 207 and remained stable at 200. Wet prep show moderate WBC , no BV, trich, yeast or clue cells noted. UDS resulted negative. Blood type A+, no antibioties detected. First BMZ given @ 0530 on 07/12/2018, second dose on 2/24 @ 0530. [place] results were, cephalic, anterior placenta, AFI 15.5cm, and no abruption or previa noted. Growth scan showed vertex 4.14lbs @ 76%, with Normal fluids levels. GBS- on 2/23. Iron studies performed at [facility] and were unremarkable on 10/29. 1H GTT on 02/07 resulted 135, which is elevated for [facility]. Patient Active Problem List   Diagnosis Date Noted  . Vaginal bleeding in pregnancy, third trimester 07/12/2018  . General counseling for prescription of oral contraceptives 07/23/2012  . Chlamydia trachomatis infection of genitourinary site 07/23/2012  . Other and unspecified ovarian cyst 07/23/2012   Plan: Vaginal Bleeding: Continue to monitor, if Bleeding occurs or pt feeling cxts, will check for cervical change, and call Dr Estanislado Pandy. May  start pt on magnesium sulfate if bleeding stable and pt is laboring.   Fetal wellbeing: BMZ x2 given on 02/23 & 02/24. Continue to monitor, and perform NSTs.   Anemia: Take IRON BID with vit c and avoid calcium with pill. May take stool softeners with to decreased constipating effect.   Elevated 1 Hour GTT at [facility]: Plan to f/u with 3 H GTT in one week post BMZ given.   MFM Recommends: GDM screening 7 to 10 days after completion of betamethasone course, and Consider discharge 24 to 48 hours after complete cessation of bleeding. If bleeding starts again then a longer period of hospitalization (possibly till delivery) may be recommended if has recurrent bleeding. Timing of delivery would be decided based on her symptoms.  Md Rivard aware of plan and verbalized agreement.   Dale , NP-C, CNM, MSN 07/14/2018. 10:45 AM

## 2018-07-15 DIAGNOSIS — O139 Gestational [pregnancy-induced] hypertension without significant proteinuria, unspecified trimester: Secondary | ICD-10-CM | POA: Clinically undetermined

## 2018-07-15 MED ORDER — POTASSIUM CHLORIDE CRYS ER 20 MEQ PO TBCR
20.0000 meq | EXTENDED_RELEASE_TABLET | Freq: Two times a day (BID) | ORAL | Status: AC
  Administered 2018-07-15 – 2018-07-16 (×4): 20 meq via ORAL
  Filled 2018-07-15 (×4): qty 1

## 2018-07-15 MED ORDER — SODIUM CHLORIDE 0.9 % IV SOLN: 510.0000 mg | Freq: Once | INTRAVENOUS | Status: DC

## 2018-07-15 MED ORDER — SODIUM CHLORIDE 0.9 % IV SOLN
510.0000 mg | Freq: Once | INTRAVENOUS | Status: AC
  Administered 2018-07-15: 510 mg via INTRAVENOUS
  Filled 2018-07-15: qty 17

## 2018-07-15 NOTE — Progress Notes (Addendum)
Bridget Summers is a 33 y.o.  G3P1 patient at [redacted]w[redacted]d.   1. [redacted] weeks gestation of pregnancy   2. Vaginal bleeding in pregnancy, third trimester   3. Vaginal bleeding     Past Medical History:  Diagnosis Date  . Ovarian cyst     Current Facility-Administered Medications  Medication Dose Route Frequency Provider Last Rate Last Dose  . 0.9 %  sodium chloride infusion  250 mL Intravenous PRN Janeece Riggers, CNM      . acetaminophen (TYLENOL) tablet 650 mg  650 mg Oral Q4H PRN Janeece Riggers, CNM      . calcium carbonate (TUMS - dosed in mg elemental calcium) chewable tablet 400 mg of elemental calcium  2 tablet Oral Q4H PRN Janeece Riggers, CNM      . docusate sodium (COLACE) capsule 100 mg  100 mg Oral BID Jaymes Graff, MD   100 mg at 07/14/18 2211  . iron polysaccharides (NIFEREX) capsule 150 mg  150 mg Oral Daily Tiwanda Threats, MD   150 mg at 07/14/18 1007  . lactated ringers infusion 500-1,000 mL  500-1,000 mL Intravenous PRN Rasch, Victorino Dike I, NP      . lactated ringers infusion   Intravenous Continuous Rasch, Victorino Dike I, NP 125 mL/hr at 07/12/18 0748    . lidocaine (PF) (XYLOCAINE) 1 % injection 30 mL  30 mL Subcutaneous PRN Rasch, Victorino Dike I, NP      . ondansetron (ZOFRAN) injection 4 mg  4 mg Intravenous Q6H PRN Rasch, Victorino Dike I, NP      . oxyCODONE-acetaminophen (PERCOCET/ROXICET) 5-325 MG per tablet 1 tablet  1 tablet Oral Q4H PRN Rasch, Victorino Dike I, NP      . oxyCODONE-acetaminophen (PERCOCET/ROXICET) 5-325 MG per tablet 2 tablet  2 tablet Oral Q4H PRN Rasch, Victorino Dike I, NP      . oxytocin (PITOCIN) IV BOLUS FROM BAG  500 mL Intravenous Once Rasch, Victorino Dike I, NP      . oxytocin (PITOCIN) IV infusion 40 units in NS 1000 mL - Premix  2.5 Units/hr Intravenous Continuous Rasch, Victorino Dike I, NP      . potassium chloride SA (K-DUR,KLOR-CON) CR tablet 20 mEq  20 mEq Oral BID Janeece Riggers, CNM      . prenatal multivitamin tablet 1 tablet  1 tablet Oral Q1200 Janeece Riggers, CNM   1  tablet at 07/14/18 1007  . sodium chloride flush (NS) 0.9 % injection 3 mL  3 mL Intravenous Q12H Janeece Riggers, CNM   3 mL at 07/14/18 2212  . sodium chloride flush (NS) 0.9 % injection 3 mL  3 mL Intravenous PRN Janeece Riggers, CNM      . sodium citrate-citric acid (ORACIT) solution 30 mL  30 mL Oral Q2H PRN Rasch, Victorino Dike I, NP      . zolpidem (AMBIEN) tablet 5 mg  5 mg Oral QHS PRN Janeece Riggers, CNM       No Known Allergies Active Problems:   Vaginal bleeding in pregnancy, third trimester  Subjective: Patient reports she had some dark brown vaginal discharge last night with a small quarter sized clot. She reported feeling mild irregular contractions last night. She denies abdominal pain. She has not noted bleeding this morning or contractions. She is feeling good fetal movement.  Objective:  Vitals:   07/14/18 2005 07/15/18 0000 07/15/18 0554 07/15/18 0800  BP: 123/68 127/77 113/62   Pulse: 91 91 93   Resp: 16 18 18  18  Temp: 98.1 F (36.7 C) 98.3 F (36.8 C) 98 F (36.7 C) 98.1 F (36.7 C)  TempSrc: Oral Oral Oral Oral  SpO2: 99% 98% 99%   Weight:      Height:       Results for orders placed or performed during the hospital encounter of 07/12/18 (from the past 24 hour(s))  CBC with Differential/Platelet     Status: Abnormal   Collection Time: 07/14/18  7:35 PM  Result Value Ref Range   WBC 9.6 4.0 - 10.5 K/uL   RBC 3.88 3.87 - 5.11 MIL/uL   Hemoglobin 8.1 (L) 12.0 - 15.0 g/dL   HCT 30.826.5 (L) 65.736.0 - 84.646.0 %   MCV 68.3 (L) 80.0 - 100.0 fL   MCH 20.9 (L) 26.0 - 34.0 pg   MCHC 30.6 30.0 - 36.0 g/dL   RDW 96.216.3 (H) 95.211.5 - 84.115.5 %   Platelets 215 150 - 400 K/uL   nRBC 0.0 0.0 - 0.2 %   Neutrophils Relative % 68 %   Neutro Abs 6.5 1.7 - 7.7 K/uL   Lymphocytes Relative 23 %   Lymphs Abs 2.2 0.7 - 4.0 K/uL   Monocytes Relative 8 %   Monocytes Absolute 0.8 0.1 - 1.0 K/uL   Eosinophils Relative 0 %   Eosinophils Absolute 0.0 0.0 - 0.5 K/uL   Basophils Relative 0 %    Basophils Absolute 0.0 0.0 - 0.1 K/uL   Immature Granulocytes 1 %   Abs Immature Granulocytes 0.07 0.00 - 0.07 K/uL   Tear Drop Cells PRESENT    Polychromasia PRESENT   Comprehensive metabolic panel     Status: Abnormal   Collection Time: 07/14/18  7:35 PM  Result Value Ref Range   Sodium 137 135 - 145 mmol/L   Potassium 2.7 (LL) 3.5 - 5.1 mmol/L   Chloride 110 98 - 111 mmol/L   CO2 21 (L) 22 - 32 mmol/L   Glucose, Bld 116 (H) 70 - 99 mg/dL   BUN 7 6 - 20 mg/dL   Creatinine, Ser 3.240.62 0.44 - 1.00 mg/dL   Calcium 8.3 (L) 8.9 - 10.3 mg/dL   Total Protein 5.6 (L) 6.5 - 8.1 g/dL   Albumin 2.6 (L) 3.5 - 5.0 g/dL   AST 16 15 - 41 U/L   ALT 16 0 - 44 U/L   Alkaline Phosphatase 52 38 - 126 U/L   Total Bilirubin 0.1 (L) 0.3 - 1.2 mg/dL   GFR calc non Af Amer >60 >60 mL/min   GFR calc Af Amer >60 >60 mL/min   Anion gap 6 5 - 15  Protein / creatinine ratio, urine     Status: None   Collection Time: 07/14/18 10:19 PM  Result Value Ref Range   Creatinine, Urine 285.07 mg/dL   Total Protein, Urine 29 mg/dL   Protein Creatinine Ratio 0.10 0.00 - 0.15 mg/mg[Cre]     Physical Exam  Constitutional: She is oriented to person, place, and time. She appears well-developed and well-nourished.  HENT:  Head: Normocephalic and atraumatic.  Eyes: Pupils are equal, round, and reactive to light.  Pulmonary/Chest: Effort normal. No respiratory distress.  Musculoskeletal: Normal range of motion.  Neurological: She is alert and oriented to person, place, and time.  Skin: Skin is warm and dry.  Psychiatric: She has a normal mood and affect. Her behavior is normal. Judgment and thought content normal.  Vitals reviewed.  FHTs: 140s, moderate beat to beat variability, +accels, - decels. Reactive category 1 FHTs.  Assessment/plan: 33 y.o. G3P1 at [redacted]w[redacted]d 3rd trimester vaginal bleeding, possible abruption Reactive NST, category 1 FHTs No uterine contractions Betamethasone complete- 2/23-2/24 GC/CT and  GBS negative  U/S 5/24- 4lbd 14oz, vertex, AFI WNL, no abruption seen.  Iron BID for anemia, per Dr. Su Hilt will give Feraheme IV x1 dose as well.   Potassium for 5 doses, repeat CMP after May consider patient for discharge if she has no bleeding for 24-48 hours   Janeece Riggers 07/15/2018   Pt seen and examined agree with above

## 2018-07-16 LAB — COMPREHENSIVE METABOLIC PANEL
ALK PHOS: 52 U/L (ref 38–126)
ALT: 14 U/L (ref 0–44)
AST: 15 U/L (ref 15–41)
Albumin: 2.5 g/dL — ABNORMAL LOW (ref 3.5–5.0)
Anion gap: 6 (ref 5–15)
BUN: 5 mg/dL — ABNORMAL LOW (ref 6–20)
CO2: 22 mmol/L (ref 22–32)
CREATININE: 0.57 mg/dL (ref 0.44–1.00)
Calcium: 8.5 mg/dL — ABNORMAL LOW (ref 8.9–10.3)
Chloride: 109 mmol/L (ref 98–111)
GFR calc Af Amer: >60 mL/min (ref >60)
GFR calc non Af Amer: >60 mL/min (ref >60)
Glucose, Bld: 88 mg/dL (ref 70–99)
Potassium: 2.9 mmol/L — ABNORMAL LOW (ref 3.5–5.1)
Sodium: 137 mmol/L (ref 135–145)
Total Bilirubin: 0.4 mg/dL (ref 0.3–1.2)
Total Protein: 5.5 g/dL — ABNORMAL LOW (ref 6.5–8.1)

## 2018-07-16 MED ORDER — POTASSIUM CHLORIDE CRYS ER 20 MEQ PO TBCR
20.0000 meq | EXTENDED_RELEASE_TABLET | Freq: Three times a day (TID) | ORAL | Status: DC
  Administered 2018-07-17: 20 meq via ORAL
  Filled 2018-07-16: qty 1

## 2018-07-16 NOTE — Progress Notes (Signed)
Hospital day # 4 pregnancy at [redacted]w[redacted]d--vaginal bleeding third trimester.  S:  Pt states no vaginal discharge or bleeding since yesterday, occ contraction, ready to go home      Perception of contractions: none, occ      Vaginal bleeding: none now       Vaginal discharge:  none  O: BP 117/80 (BP Location: Left Arm)   Pulse 100   Temp 98.5 F (36.9 C) (Oral)   Resp 18   Ht 5\' 5"  (1.651 m)   Wt 110.2 kg   LMP 11/28/2017   SpO2 100%   BMI 40.44 kg/m       Fetal tracings:Fhts 15o0 accels no decels, variability present      Contractions:  rate      Uterus gravid and non-tender      Extremities: extremities normal, atraumatic, no cyanosis or edema and no significant edema and no signs of DVT          Labs:  Will do CMP after last kdur dose       Meds: PNV  U/S 5/24- 4lbd 14oz, vertex, AFI WNL, no abruption seen. A: [redacted]w[redacted]d with third trimester vaginal bleeding     Stable Reactive NST 2. Anemia 3. hypokalemia  P: Continue current plan of care      Upcoming tests/treatments:   2. Iron bid  3. After 5 th dose Kdur will obtain a CMP Consider discharge later today with follow up early next week.     Kenney Houseman CNM, MSN 07/16/2018 9:49 AM

## 2018-07-17 LAB — MAGNESIUM: Magnesium: 1.9 mg/dL (ref 1.7–2.4)

## 2018-07-17 MED ORDER — POTASSIUM CHLORIDE CRYS ER 20 MEQ PO TBCR: 20.0000 meq | EXTENDED_RELEASE_TABLET | Freq: Three times a day (TID) | ORAL | 0 refills | Status: DC

## 2018-07-17 MED ORDER — POLYSACCHARIDE IRON COMPLEX 150 MG PO CAPS: 150.0000 mg | ORAL_CAPSULE | Freq: Every day | ORAL | 2 refills | Status: DC

## 2018-07-17 NOTE — Discharge Summary (Signed)
OB Discharge Summary     Patient Name: Bridget Summers DOB: 1985/12/17 MRN: [id number]  Date of admission: 07/12/2018 Date of discharge: 07/17/2018  Admitting diagnosis: Vaginal bleeding in pregnancy, third trimester [O46.93] Intrauterine pregnancy: [redacted]w[redacted]d     Secondary diagnosis:  Active Problems:   Vaginal bleeding in pregnancy, third trimester   Gestational hypertension  Additional problems: Hypokalemia, anemia     Discharge diagnosis: Gestational Hypertension, Anemia and Vaginal bleeding in pregnancy, third trimester                                                Physical exam  Vitals:   07/16/18 1920 07/17/18 0000 07/17/18 0330 07/17/18 0850  BP: 135/71 121/68 (!) 114/51 131/78  Pulse: (!) 112 (!) 102 96 94  Resp: 18 19 18 16   Temp: 98.5 F (36.9 C) 99.1 F (37.3 C) 98.2 F (36.8 C) 98.7 F (37.1 C)  TempSrc: Oral Oral Oral Oral  SpO2: 98% 98% 99% 100%  Weight:      Height:       General: alert, cooperative and no distress.  Vaginal bleeding: Cervix remains unchanged and vaginal bleeding has been stopped for greater than 24 hours. No vaginal bleeding noted on exam today. Patient is having mild, rare braxton hicks contractions. NST is reactive and category 1. Per Dr. Estanislado Pandy patient meets criteria for discharge today.  DVT Evaluation: No evidence of DVT seen on physical exam. Negative Homan's sign. No cords or calf tenderness. No significant calf/ankle edema. Gestational hypertension: Blood pressures are normotensive, patient denies symptoms of preeclampsia: headache, visual changes, right upper quadrant pain. Preeclampsia labs were negative. Preeclampsia precautions discussed and handout given.   Labs: Lab Results  Component Value Date   WBC 9.6 07/14/2018   HGB 8.1 (L) 07/14/2018   HCT 26.5 (L) 07/14/2018   MCV 68.3 (L) 07/14/2018   PLT 215 07/14/2018   CMP Latest Ref Rng & Units 07/16/2018  Glucose 70 - 99 mg/dL 88  BUN 6 - 20 mg/dL 5(L)  Creatinine 1.77 -  1.00 mg/dL 9.39  Sodium 030 - 092 mmol/L 137  Potassium 3.5 - 5.1 mmol/L 2.9(L)  Chloride 98 - 111 mmol/L 109  CO2 22 - 32 mmol/L 22  Calcium 8.9 - 10.3 mg/dL 3.3(A)  Total Protein 6.5 - 8.1 g/dL 0.7(M)  Total Bilirubin 0.3 - 1.2 mg/dL 0.4  Alkaline Phos 38 - 126 U/L 52  AST 15 - 41 U/L 15  ALT 0 - 44 U/L 14    Discharge instruction: per After Visit Summary. Vaginal bleeding precautions discussed and handout given. Handout also provided on hypokalemia.   After visit meds:  Allergies as of 07/17/2018   No Known Allergies     Medication List    STOP taking these medications   metroNIDAZOLE 500 MG tablet Commonly known as:  FLAGYL   terconazole 0.4 % vaginal cream Commonly known as:  TERAZOL 7     TAKE these medications   iron polysaccharides 150 MG capsule Commonly known as:  NIFEREX Take 1 capsule (150 mg total) by mouth daily. Start taking on:  July 18, 2018   potassium chloride SA 20 MEQ tablet Commonly known as:  K-DUR,KLOR-CON Take 1 tablet (20 mEq total) by mouth 3 (three) times daily for 7 days.   prenatal multivitamin Tabs tablet Take 1 tablet by mouth  daily at 12 noon.       Diet: routine diet  Activity: Advance as tolerated. Avoid sexual intercourse or anything that would give you an orgasm until cleared by [facility] provider at your next visit.    Outpatient follow up:1 week at [facility] for follow up and repeat CBC and CMP Follow up Appt:No future appointments. Follow up Visit:No follow-ups on file.   07/17/2018 Janeece Riggers, CNM

## 2018-07-17 NOTE — Progress Notes (Signed)
Discharge instructions given to patient. Discussed follow up appointments and medication changes. Patient educated regarding times of day to take medications. Patient educated regarding preterm labor and vaginal bleeding.

## 2018-07-17 NOTE — Discharge Instructions (Signed)
Vaginal Bleeding During Pregnancy, Third Trimester  A small amount of bleeding (spotting) from the vagina is common during pregnancy. Sometimes the bleeding is normal and is not a problem, and sometimes it is a sign of something serious. Tell your doctor about any bleeding from your vagina right away. Follow these instructions at home: Activity  Follow your doctor's instructions about how active you can be. Your doctor may recommend that you: ? Stay in bed and only get up to use the bathroom. ? Continue light activity.  If needed, make plans for someone to help you with your normal activities.  Ask your doctor if it is safe for you to drive.  Do not lift anything that is heavier than 10 lb (4.5 kg) until your doctor says that this is safe.  Do not have sex or orgasms until your doctor says that this is safe. Medicines  Take over-the-counter and prescription medicines only as told by your doctor.  Do not take aspirin. It can cause bleeding. General instructions  Watch your condition for any changes.  Write down: ? The number of pads you use each day. ? How often you change pads. ? How soaked (saturated) your pads are.  Do not use tampons.  Do not douche.  If you pass any tissue from your vagina, save the tissue to show your doctor.  Keep all follow-up visits as told by your doctor. This is important. Contact a doctor if:  You have vaginal bleeding at any time during pregnancy.  You have cramps.  You have a fever. Get help right away if:  You have very bad cramps.  You have very bad pain in your back or belly (abdomen).  You have a gush of fluid from your vagina.  You pass large clots or a lot of tissue from your vagina.  Your bleeding gets worse.  You feel light-headed or weak.  You pass out (faint).  Your baby is moving less than usual, or not moving at all. Summary  Tell your doctor about any bleeding from your vagina right away.  Follow instructions  from your doctor about how active you can be. You may need someone to help you with your normal activities. This information is not intended to replace advice given to you by your health care provider. Make sure you discuss any questions you have with your health care provider. Document Released: 09/20/2013 Document Revised: 08/07/2016 Document Reviewed: 08/07/2016 Elsevier Interactive Patient Education  2019 [employer]   Hypokalemia Hypokalemia means that the amount of potassium in the blood is lower than normal.Potassium is a chemical that helps regulate the amount of fluid in the body (electrolyte). It also stimulates muscle tightening (contraction) and helps nerves work properly.Normally, most of the bodys potassium is inside of cells, and only a very small amount is in the blood. Because the amount in the blood is so small, minor changes to potassium levels in the blood can be life-threatening. What are the causes? This condition may be caused by:  Antibiotic medicine.  Diarrhea or vomiting. Taking too much of a medicine that helps you have a bowel movement (laxative) can cause diarrhea and lead to hypokalemia.  Chronic kidney disease (CKD).  Medicines that help the body get rid of excess fluid (diuretics).  Eating disorders, such as bulimia.  Low magnesium levels in the body.  Sweating a lot. What are the signs or symptoms? Symptoms of this condition include:  Weakness.  Constipation.  Fatigue.  Muscle cramps.  Mental confusion.  Skipped heartbeats or irregular heartbeat (palpitations).  Tingling or numbness. How is this diagnosed? This condition is diagnosed with a blood test. How is this treated? Hypokalemia can be treated by taking potassium supplements by mouth or adjusting the medicines that you take. Treatment may also include eating more foods that contain a lot of potassium. If your potassium level is very low, you may need to get potassium through an  IV tube in one of your veins and be monitored in the hospital. Follow these instructions at home:   Take over-the-counter and prescription medicines only as told by your health care provider. This includes vitamins and supplements.  Eat a healthy diet. A healthy diet includes fresh fruits and vegetables, whole grains, healthy fats, and lean proteins.  If instructed, eat more foods that contain a lot of potassium, such as: ? Nuts, such as peanuts and pistachios. ? Seeds, such as sunflower seeds and pumpkin seeds. ? Peas, lentils, and lima beans. ? Whole grain and bran cereals and breads. ? Fresh fruits and vegetables, such as apricots, avocado, bananas, cantaloupe, kiwi, oranges, tomatoes, asparagus, and potatoes. ? Orange juice. ? Tomato juice. ? Red meats. ? Yogurt.  Keep all follow-up visits as told by your health care provider. This is important. Contact a health care provider if:  You have weakness that gets worse.  You feel your heart pounding or racing.  You vomit.  You have diarrhea.  You have diabetes (diabetes mellitus) and you have trouble keeping your blood sugar (glucose) in your target range. Get help right away if:  You have chest pain.  You have shortness of breath.  You have vomiting or diarrhea that lasts for more than 2 days.  You faint. This information is not intended to replace advice given to you by your health care provider. Make sure you discuss any questions you have with your health care provider. Document Released: 05/06/2005 Document Revised: 12/23/2015 Document Reviewed: 12/23/2015 Elsevier Interactive Patient Education  2019 Elsevier Inc.   Hypertension During Pregnancy  Hypertension is also called high blood pressure. High blood pressure means that the force of your blood moving in your body is too strong. When you are pregnant, this condition should be watched carefully. It can cause problems for you and your baby. Follow these  instructions at home: Eating and drinking   Drink enough fluid to keep your pee (urine) pale yellow.  Avoid caffeine. Lifestyle  Do not use any products that contain nicotine or tobacco, such as cigarettes and e-cigarettes. If you need help quitting, ask your doctor.  Do not use alcohol or drugs.  Avoid stress.  Rest and get plenty of sleep. General instructions  Take over-the-counter and prescription medicines only as told by your doctor.  While lying down, lie on your left side. This keeps pressure off your major blood vessels.  While sitting or lying down, raise (elevate) your feet. Try putting some pillows under your lower legs.  Exercise regularly. Ask your doctor what kinds of exercise are best for you.  Keep all prenatal and follow-up visits as told by your doctor. This is important. Contact a doctor if:  You have symptoms that your doctor told you to watch for, such as: ? Throwing up (vomiting). ? Feeling sick to your stomach (nausea). ? Headache. Get help right away if you have:  Very bad belly pain that does not get better with treatment.  A very bad headache that does not get better.  Throwing  up that does not get better with treatment.  Sudden, fast weight gain.  Sudden swelling in your hands, ankles, or face.  Bleeding from your vagina.  Blood in your pee.  Fewer movements from your baby than usual.  Blurry vision.  Double vision.  Muscle twitching.  Sudden muscle tightening (spasms).  Trouble breathing.  Blue fingernails or lips. Summary  Hypertension is also called high blood pressure. High blood pressure means that the force of your blood moving in your body is too strong.  When you are pregnant, this condition should be watched carefully. It can cause problems for you and your baby.  Get help right away if you have symptoms that your doctor told you to watch for. This information is not intended to replace advice given to you by  your health care provider. Make sure you discuss any questions you have with your health care provider. Document Released: 06/08/2010 Document Revised: 04/22/2017 Document Reviewed: 01/16/2016 Elsevier Interactive Patient Education  2019 [employer].

## 2018-07-24 DIAGNOSIS — E876 Hypokalemia: Secondary | ICD-10-CM | POA: Diagnosis not present

## 2018-07-24 DIAGNOSIS — O99019 Anemia complicating pregnancy, unspecified trimester: Secondary | ICD-10-CM | POA: Diagnosis not present

## 2018-07-28 DIAGNOSIS — O99810 Abnormal glucose complicating pregnancy: Secondary | ICD-10-CM | POA: Diagnosis not present

## 2018-08-20 LAB — OB RESULTS CONSOLE GBS: GBS: POSITIVE

## 2018-09-01 ENCOUNTER — Encounter (HOSPITAL_COMMUNITY): Payer: Self-pay | Admitting: *Deleted

## 2018-09-01 ENCOUNTER — Inpatient Hospital Stay (HOSPITAL_COMMUNITY)
Admission: AD | Admit: 2018-09-01 | Discharge: 2018-09-03 | DRG: 807 | Disposition: A | Discharge status: Home / Self Care | Payer: Medicaid Other | Attending: Obstetrics & Gynecology | Admitting: Obstetrics & Gynecology

## 2018-09-01 ENCOUNTER — Other Ambulatory Visit: Payer: Self-pay

## 2018-09-01 DIAGNOSIS — O99824 Streptococcus B carrier state complicating childbirth: Principal | ICD-10-CM | POA: Diagnosis present

## 2018-09-01 DIAGNOSIS — Z0371 Encounter for suspected problem with amniotic cavity and membrane ruled out: Secondary | ICD-10-CM

## 2018-09-01 DIAGNOSIS — D649 Anemia, unspecified: Secondary | ICD-10-CM | POA: Diagnosis present

## 2018-09-01 DIAGNOSIS — Z3A39 39 weeks gestation of pregnancy: Secondary | ICD-10-CM

## 2018-09-01 DIAGNOSIS — O9902 Anemia complicating childbirth: Secondary | ICD-10-CM | POA: Diagnosis present

## 2018-09-01 DIAGNOSIS — O99214 Obesity complicating childbirth: Secondary | ICD-10-CM | POA: Diagnosis present

## 2018-09-01 DIAGNOSIS — Z8759 Personal history of other complications of pregnancy, childbirth and the puerperium: Secondary | ICD-10-CM

## 2018-09-01 NOTE — MAU Note (Signed)
PT SAYS SHE NOTICED FLUID LEAKING  AT 902PM- WHILE HAVING A UC.    LAST SEX- MARCH.   FEELS UC.  PNC WITH  [facility]-  VE 2 WEEKS AGO-  3 CM.   DENIES HSV AND MRSA.   GBS-  NEG

## 2018-09-01 NOTE — MAU Note (Signed)
Pt here with c/o regular contractions since about 1930 tonight. Denies any bleeding, but having some leaking that started about 2100. Reports good fetal movement.

## 2018-09-02 ENCOUNTER — Other Ambulatory Visit: Payer: Self-pay

## 2018-09-02 ENCOUNTER — Inpatient Hospital Stay (HOSPITAL_COMMUNITY): Payer: Medicaid Other | Admitting: Anesthesiology

## 2018-09-02 ENCOUNTER — Encounter (HOSPITAL_COMMUNITY): Payer: Self-pay

## 2018-09-02 DIAGNOSIS — O134 Gestational [pregnancy-induced] hypertension without significant proteinuria, complicating childbirth: Secondary | ICD-10-CM | POA: Diagnosis not present

## 2018-09-02 DIAGNOSIS — Z0371 Encounter for suspected problem with amniotic cavity and membrane ruled out: Secondary | ICD-10-CM | POA: Diagnosis not present

## 2018-09-02 DIAGNOSIS — O9902 Anemia complicating childbirth: Secondary | ICD-10-CM | POA: Diagnosis present

## 2018-09-02 DIAGNOSIS — O26893 Other specified pregnancy related conditions, third trimester: Secondary | ICD-10-CM | POA: Diagnosis present

## 2018-09-02 DIAGNOSIS — Z8759 Personal history of other complications of pregnancy, childbirth and the puerperium: Secondary | ICD-10-CM

## 2018-09-02 DIAGNOSIS — O99824 Streptococcus B carrier state complicating childbirth: Secondary | ICD-10-CM | POA: Diagnosis present

## 2018-09-02 DIAGNOSIS — O99214 Obesity complicating childbirth: Secondary | ICD-10-CM | POA: Diagnosis present

## 2018-09-02 DIAGNOSIS — D649 Anemia, unspecified: Secondary | ICD-10-CM | POA: Diagnosis present

## 2018-09-02 DIAGNOSIS — Z3A39 39 weeks gestation of pregnancy: Secondary | ICD-10-CM | POA: Diagnosis not present

## 2018-09-02 LAB — CBC
HCT: 32.9 % — ABNORMAL LOW (ref 36.0–46.0)
Hemoglobin: 10.3 g/dL — ABNORMAL LOW (ref 12.0–15.0)
MCH: 21.8 pg — ABNORMAL LOW (ref 26.0–34.0)
MCHC: 31.3 g/dL (ref 30.0–36.0)
MCV: 69.7 fL — ABNORMAL LOW (ref 80.0–100.0)
Platelets: 205 10*3/uL (ref 150–400)
RBC: 4.72 MIL/uL (ref 3.87–5.11)
RDW: 17.3 % — ABNORMAL HIGH (ref 11.5–15.5)
WBC: 10.7 10*3/uL — ABNORMAL HIGH (ref 4.0–10.5)
nRBC: 0 % (ref 0.0–0.2)

## 2018-09-02 LAB — COMPREHENSIVE METABOLIC PANEL
ALT: 17 U/L (ref 0–44)
AST: 20 U/L (ref 15–41)
Albumin: 3 g/dL — ABNORMAL LOW (ref 3.5–5.0)
Alkaline Phosphatase: 94 U/L (ref 38–126)
Anion gap: 13 (ref 5–15)
BUN: 7 mg/dL (ref 6–20)
CO2: 20 mmol/L — ABNORMAL LOW (ref 22–32)
Calcium: 9.2 mg/dL (ref 8.9–10.3)
Chloride: 106 mmol/L (ref 98–111)
Creatinine, Ser: 0.62 mg/dL (ref 0.44–1.00)
GFR calc Af Amer: >60 mL/min (ref >60)
GFR calc non Af Amer: >60 mL/min (ref >60)
Glucose, Bld: 76 mg/dL (ref 70–99)
Potassium: 3.4 mmol/L — ABNORMAL LOW (ref 3.5–5.1)
Sodium: 139 mmol/L (ref 135–145)
Total Bilirubin: 0.6 mg/dL (ref 0.3–1.2)
Total Protein: 6.4 g/dL — ABNORMAL LOW (ref 6.5–8.1)

## 2018-09-02 LAB — TYPE AND SCREEN
ABO/RH(D): A POS
Antibody Screen: NEGATIVE

## 2018-09-02 LAB — PROTEIN / CREATININE RATIO, URINE
Creatinine, Urine: 340.05 mg/dL
Protein Creatinine Ratio: 0.16 mg/mg{Cre} — ABNORMAL HIGH (ref 0.00–0.15)
Total Protein, Urine: 53 mg/dL

## 2018-09-02 LAB — POCT FERN TEST: POCT Fern Test: NEGATIVE

## 2018-09-02 MED ORDER — IBUPROFEN 600 MG PO TABS
600.0000 mg | ORAL_TABLET | Freq: Four times a day (QID) | ORAL | Status: DC
  Administered 2018-09-02 – 2018-09-03 (×3): 600 mg via ORAL
  Filled 2018-09-02 (×4): qty 1

## 2018-09-02 MED ORDER — ACETAMINOPHEN 325 MG PO TABS: 650.0000 mg | ORAL_TABLET | ORAL | Status: DC | PRN

## 2018-09-02 MED ORDER — OXYTOCIN 40 UNITS IN NORMAL SALINE INFUSION - SIMPLE MED
2.5000 [IU]/h | INTRAVENOUS | Status: DC
  Administered 2018-09-02: 12:00:00 2.5 [IU]/h via INTRAVENOUS
  Filled 2018-09-02: qty 1000

## 2018-09-02 MED ORDER — LACTATED RINGERS IV SOLN
500.0000 mL | INTRAVENOUS | Status: DC | PRN
  Administered 2018-09-02: 02:00:00 1000 mL via INTRAVENOUS

## 2018-09-02 MED ORDER — PHENYLEPHRINE 40 MCG/ML (10ML) SYRINGE FOR IV PUSH (FOR BLOOD PRESSURE SUPPORT)
80.0000 ug | PREFILLED_SYRINGE | INTRAVENOUS | Status: DC | PRN
  Filled 2018-09-02: qty 10

## 2018-09-02 MED ORDER — ZOLPIDEM TARTRATE 5 MG PO TABS: 5.0000 mg | ORAL_TABLET | Freq: Every evening | ORAL | Status: DC | PRN

## 2018-09-02 MED ORDER — SODIUM CHLORIDE 0.9 % IV SOLN
2.0000 g | Freq: Once | INTRAVENOUS | Status: AC
  Administered 2018-09-02: 06:00:00 2 g via INTRAVENOUS
  Filled 2018-09-02: qty 2000

## 2018-09-02 MED ORDER — DIPHENHYDRAMINE HCL 25 MG PO CAPS: 25.0000 mg | ORAL_CAPSULE | Freq: Four times a day (QID) | ORAL | Status: DC | PRN

## 2018-09-02 MED ORDER — LIDOCAINE HCL (PF) 1 % IJ SOLN
30.0000 mL | INTRAMUSCULAR | Status: DC | PRN
  Filled 2018-09-02: qty 30

## 2018-09-02 MED ORDER — ACETAMINOPHEN 325 MG PO TABS
650.0000 mg | ORAL_TABLET | ORAL | Status: DC | PRN
  Administered 2018-09-02: 650 mg via ORAL
  Filled 2018-09-02: qty 2

## 2018-09-02 MED ORDER — EPHEDRINE 5 MG/ML INJ: 10.0000 mg | INTRAVENOUS | Status: DC | PRN

## 2018-09-02 MED ORDER — SODIUM CHLORIDE 0.9 % IV SOLN: 1.0000 g | Freq: Four times a day (QID) | INTRAVENOUS | Status: DC

## 2018-09-02 MED ORDER — SIMETHICONE 80 MG PO CHEW: 80.0000 mg | CHEWABLE_TABLET | ORAL | Status: DC | PRN

## 2018-09-02 MED ORDER — LACTATED RINGERS IV SOLN
INTRAVENOUS | Status: DC
  Administered 2018-09-02 (×2): via INTRAVENOUS

## 2018-09-02 MED ORDER — PHENYLEPHRINE 40 MCG/ML (10ML) SYRINGE FOR IV PUSH (FOR BLOOD PRESSURE SUPPORT)
80.0000 ug | PREFILLED_SYRINGE | INTRAVENOUS | Status: AC | PRN
  Administered 2018-09-02: 03:00:00 160 ug via INTRAVENOUS
  Administered 2018-09-02 (×2): 80 ug via INTRAVENOUS

## 2018-09-02 MED ORDER — LIDOCAINE-EPINEPHRINE (PF) 2 %-1:200000 IJ SOLN
INTRAMUSCULAR | Status: DC | PRN
  Administered 2018-09-02: 2 mL via EPIDURAL
  Administered 2018-09-02: 3 mL via EPIDURAL

## 2018-09-02 MED ORDER — BENZOCAINE-MENTHOL 20-0.5 % EX AERO: 1.0000 "application " | INHALATION_SPRAY | CUTANEOUS | Status: DC | PRN

## 2018-09-02 MED ORDER — OXYTOCIN BOLUS FROM INFUSION
500.0000 mL | Freq: Once | INTRAVENOUS | Status: AC
  Administered 2018-09-02: 12:00:00 500 mL via INTRAVENOUS

## 2018-09-02 MED ORDER — WITCH HAZEL-GLYCERIN EX PADS: 1.0000 "application " | MEDICATED_PAD | CUTANEOUS | Status: DC | PRN

## 2018-09-02 MED ORDER — COCONUT OIL OIL: 1.0000 "application " | TOPICAL_OIL | Status: DC | PRN

## 2018-09-02 MED ORDER — ONDANSETRON HCL 4 MG/2ML IJ SOLN: 4.0000 mg | Freq: Four times a day (QID) | INTRAMUSCULAR | Status: DC | PRN

## 2018-09-02 MED ORDER — OXYCODONE-ACETAMINOPHEN 5-325 MG PO TABS: 2.0000 | ORAL_TABLET | ORAL | Status: DC | PRN

## 2018-09-02 MED ORDER — OXYCODONE-ACETAMINOPHEN 5-325 MG PO TABS: 1.0000 | ORAL_TABLET | ORAL | Status: DC | PRN

## 2018-09-02 MED ORDER — ONDANSETRON HCL 4 MG/2ML IJ SOLN: 4.0000 mg | INTRAMUSCULAR | Status: DC | PRN

## 2018-09-02 MED ORDER — ONDANSETRON HCL 4 MG PO TABS: 4.0000 mg | ORAL_TABLET | ORAL | Status: DC | PRN

## 2018-09-02 MED ORDER — DIPHENHYDRAMINE HCL 50 MG/ML IJ SOLN: 12.5000 mg | INTRAMUSCULAR | Status: DC | PRN

## 2018-09-02 MED ORDER — SODIUM CHLORIDE 0.9 % IV SOLN
1.0000 g | INTRAVENOUS | Status: DC
  Administered 2018-09-02: 11:00:00 1 g via INTRAVENOUS
  Filled 2018-09-02: qty 1000
  Filled 2018-09-02: qty 1
  Filled 2018-09-02 (×11): qty 1000

## 2018-09-02 MED ORDER — SODIUM CHLORIDE (PF) 0.9 % IJ SOLN
INTRAMUSCULAR | Status: DC | PRN
  Administered 2018-09-02: 14 mL/h via EPIDURAL

## 2018-09-02 MED ORDER — FLEET ENEMA 7-19 GM/118ML RE ENEM: 1.0000 | ENEMA | RECTAL | Status: DC | PRN

## 2018-09-02 MED ORDER — PRENATAL MULTIVITAMIN CH
1.0000 | ORAL_TABLET | Freq: Every day | ORAL | Status: DC
  Administered 2018-09-03: 12:00:00 1 via ORAL
  Filled 2018-09-02: qty 1

## 2018-09-02 MED ORDER — SOD CITRATE-CITRIC ACID 500-334 MG/5ML PO SOLN: 30.0000 mL | ORAL | Status: DC | PRN

## 2018-09-02 MED ORDER — LACTATED RINGERS IV SOLN
500.0000 mL | Freq: Once | INTRAVENOUS | Status: AC
  Administered 2018-09-02: 500 mL via INTRAVENOUS

## 2018-09-02 MED ORDER — OXYCODONE-ACETAMINOPHEN 5-325 MG PO TABS
1.0000 | ORAL_TABLET | ORAL | Status: DC | PRN
  Administered 2018-09-03: 1 via ORAL
  Filled 2018-09-02: qty 1

## 2018-09-02 MED ORDER — FENTANYL-BUPIVACAINE-NACL 0.5-0.125-0.9 MG/250ML-% EP SOLN
12.0000 mL/h | EPIDURAL | Status: DC | PRN
  Filled 2018-09-02: qty 250

## 2018-09-02 MED ORDER — DIBUCAINE (PERIANAL) 1 % EX OINT: 1.0000 "application " | TOPICAL_OINTMENT | CUTANEOUS | Status: DC | PRN

## 2018-09-02 MED ORDER — MAGNESIUM HYDROXIDE 400 MG/5ML PO SUSP: 30.0000 mL | ORAL | Status: DC | PRN

## 2018-09-02 NOTE — Anesthesia Preprocedure Evaluation (Addendum)
Anesthesia Evaluation  Patient identified by MRN, date of birth, ID band Patient awake    Reviewed: Allergy & Precautions, NPO status , Patient's Chart, lab work & pertinent test results  Airway Mallampati: III  TM Distance: >3 FB Neck ROM: Full    Dental no notable dental hx.    Pulmonary neg pulmonary ROS,    Pulmonary exam normal breath sounds clear to auscultation       Cardiovascular negative cardio ROS Normal cardiovascular exam Rhythm:Regular Rate:Normal     Neuro/Psych negative neurological ROS  negative psych ROS   GI/Hepatic negative GI ROS, Neg liver ROS,   Endo/Other  Morbid obesity  Renal/GU negative Renal ROS  negative genitourinary   Musculoskeletal negative musculoskeletal ROS (+)   Abdominal   Peds negative pediatric ROS (+)  Hematology  (+) Blood dyscrasia, anemia ,   Anesthesia Other Findings gHTN  Reproductive/Obstetrics (+) Pregnancy                            Anesthesia Physical Anesthesia Plan  ASA: III  Anesthesia Plan: Epidural   Post-op Pain Management:    Induction:   PONV Risk Score and Plan: Treatment may vary due to age or medical condition  Airway Management Planned: Natural Airway  Additional Equipment:   Intra-op Plan:   Post-operative Plan:   Informed Consent: I have reviewed the patients History and Physical, chart, labs and discussed the procedure including the risks, benefits and alternatives for the proposed anesthesia with the patient or authorized representative who has indicated his/her understanding and acceptance.       Plan Discussed with: Anesthesiologist  Anesthesia Plan Comments: (Patient identified. Risks, benefits, options discussed with patient including but not limited to bleeding, infection, nerve damage, paralysis, failed block, incomplete pain control, headache, blood pressure changes, nausea, vomiting, reactions to  medication, itching, and post partum back pain. Confirmed with bedside nurse the patient's most recent platelet count. Confirmed with the patient that they are not taking any anticoagulation, have any bleeding history or any family history of bleeding disorders. Patient expressed understanding and wishes to proceed. All questions were answered. )        Anesthesia Quick Evaluation

## 2018-09-02 NOTE — H&P (Signed)
Bridget Summers is a 33 y.o. female, G3P1011 at 39.5 weeks, presenting for labor contractions.  Reported SROM but noted to be intact in MAU. Denies vaginal bleeding.  FM+  Patient Active Problem List   Diagnosis Date Noted  . Normal labor 09/02/2018  . Gestational hypertension 07/15/2018  . Vaginal bleeding in pregnancy, third trimester 07/12/2018  . General counseling for prescription of oral contraceptives 07/23/2012  . Chlamydia trachomatis infection of genitourinary site 07/23/2012  . Other and unspecified ovarian cyst 07/23/2012    History of present pregnancy: Patient entered care at 11.4 weeks.   EDC of 39.5 was established by LMP.   Anatomy scan: 19.5 weeks, with normal findings and an lateral placenta.   Additional [place] evaluations:  Repeat in 4 weeks to finish anatomy screen  Significant prenatal events: bleeding at 34 weeks had betamethasone   Last evaluation: last week  OB History    Gravida  3   Para  1   Term  1   Preterm      AB  1   Living  1     SAB      TAB  1   Ectopic      Multiple      Live Births  1          Past Medical History:  Diagnosis Date  . Ovarian cyst    Past Surgical History:  Procedure Laterality Date  . NO PAST SURGERIES     Family History: family history includes Diabetes in her paternal grandfather; Healthy in her mother; Heart disease in her maternal grandmother; Hypertension in her maternal grandmother; Lupus in her father; Stroke in her paternal grandfather. Social History:  reports that she has never smoked. She has never used smokeless tobacco. She reports current alcohol use. She reports that she does not use drugs.   Prenatal Transfer Tool  Maternal Diabetes: No Genetic Screening: Declined Maternal Ultrasounds/Referrals: Normal Fetal Ultrasounds or other Referrals:  None Maternal Substance Abuse:  No Significant Maternal Medications:  None Significant Maternal Lab Results: None  TDAP UTD Flu  Declined  ROS:  All 10 systems reviewed and negative unless stated above  No Known Allergies   Dilation: 5.5(5.5-6) Effacement (%): 80 Station: -1 Exam by:: Adah Perl RN Blood pressure 131/83, pulse 86, temperature 98.2 F (36.8 C), temperature source Oral, resp. rate 18, height 5\' 5"  (1.651 m), weight 112 kg, last menstrual period 11/28/2017, SpO2 100 %.  Chest clear Heart RRR without murmur Abd gravid, NT, FH appropriate Pelvic: adequate Ext: +2/+2 neg  FHR: Category 1 FHT 140 accels, positive variability no decels UCs: 3 in 10 minutes  Prenatal labs: ABO, Rh: --/--/A POS, A POS Performed at [facility], [address], [place], [place] [address]  [phone]) Antibody: NEG (02/23 0817) Rubella:   Immune RPR: Non Reactive (02/23 0452)  HBsAg:   NR HIV:   NR GBS:   Sickle cell/Hgb electrophoresis: AA GC:  Neg Chlamydia: Neg Genetic screenings: Declined Glucola: 135 Other:  Three hour negative Hgb at 10.1 NOB, 9.1 at 28 weeks       Assessment/Plan: IUP at 39.5 in active labor Cat 1 strip  Plan: Admit to Birthing Suite  Routine [facility] orders Pain med/epidural prn   Henderson Newcomer ProtheroCNM, MSN 09/02/2018, 1:37 AM

## 2018-09-02 NOTE — Progress Notes (Addendum)
Subjective: Pt comfortable with epidural.  Call to evaluate strip due to variable and prolong decel. BP low after epidural placement.  RN giving medication to increase BP.   Objective: BP 116/72   Pulse 84   Temp 98.5 F (36.9 C) (Oral)   Resp 17   Ht 5\' 5"  (1.651 m)   Wt 112 kg   LMP 11/28/2017   SpO2 100%   BMI 41.10 kg/m  No intake/output data recorded. No intake/output data recorded.  FHT: Category 2 FHT 140 occ variable or late decel with contraction with quick retrun to baseline.   UC:   regular, every 4-6 minutes SVE:   Dilation: 5.5(5.5-6) Effacement (%): 80 Station: -1 Exam by:: Adah Perl RN    Assessment:  G3P1011 at 39.5 IUP active labor  Cat 2    Plan: Monitor strip Anticipate SVD  Kenney Houseman CNM, MSN 09/02/2018, 3:52 AM

## 2018-09-02 NOTE — MAU Provider Note (Signed)
First Provider Initiated Contact with Patient 09/02/18 0001       S: [name] is a 33 y.o. G3P1011 at [redacted]w[redacted]d  who presents to MAU today complaining of leaking of fluid since 9 pm. Reports small trickle of fluid x 2 episodes tonight. Doesn't feel like leaking has continued. She denies vaginal bleeding. She endorses contractions. She reports normal fetal movement.    O: BP 131/83 (BP Location: Right Arm)   Pulse 86   Temp 98.2 F (36.8 C) (Oral)   Resp 18   Ht 5\' 5"  (1.651 m)   Wt 112 kg   LMP 11/28/2017   SpO2 100%   BMI 41.10 kg/m  GENERAL: Well-developed, well-nourished female in no acute distress.  HEAD: Normocephalic, atraumatic.  CHEST: Normal effort of breathing, regular heart rate ABDOMEN: Soft, nontender, gravid PELVIC: Normal external female genitalia. Vagina is pink and rugated. Cervix with normal contour, no lesions. Normal discharge.  No pooling.   Cervical exam:  Dilation: 5.5(5.5-6) Effacement (%): 80 Cervical Position: Posterior Station: -1 Presentation: Vertex Exam by:: Adah Perl RN   Fetal Monitoring: Baseline: 150 Variability: moderate Accelerations: 15x15 Decelerations: none Contractions: q3-6 mins  Results for orders placed or performed during the hospital encounter of 09/01/18 (from the past 24 hour(s))  POCT fern test     Status: None   Collection Time: 09/02/18 12:17 AM  Result Value Ref Range   POCT Fern Test Negative = intact amniotic membranes      A: SIUP at [redacted]w[redacted]d  Membranes intact  -pt not ruptured but is laboring & has made cervical change  P: RN to call [facility] CNM for admission.   Judeth Horn, NP 09/02/2018 1:04 AM

## 2018-09-02 NOTE — Progress Notes (Signed)
Subjective: Pt sleeping with epidural Pt noted to be GBS positive Objective: BP 107/62   Pulse 82   Temp 98.3 F (36.8 C) (Oral)   Resp 16   Ht 5\' 5"  (1.651 m)   Wt 112 kg   LMP 11/28/2017   SpO2 100%   BMI 41.10 kg/m  No intake/output data recorded. No intake/output data recorded.  FHT: Category 1 FHT 135 accels, no decels UC:   regular, every 2-4 minutes SVE:   Dilation: 6 Effacement (%): 90 Station: -2 Exam by:: e. poore, rnc  Assessment:  G3P1011 at 39.5 IUP in active labor Cat 1 strip GBS positive  Plan: Treated with Ampicillin 2 GM due to advance cm Anticipate SVD   Kenney Houseman CNM, MSN 09/02/2018, 7:23 AM

## 2018-09-02 NOTE — Anesthesia Postprocedure Evaluation (Signed)
Anesthesia Post Note  Patient: Bridget Summers  Procedure(s) Performed: AN AD HOC LABOR EPIDURAL     Patient location during evaluation: Mother Baby Anesthesia Type: Epidural Level of consciousness: awake and alert Pain management: pain level controlled Vital Signs Assessment: post-procedure vital signs reviewed and stable Respiratory status: spontaneous breathing, nonlabored ventilation and respiratory function stable Cardiovascular status: stable Postop Assessment: no headache, no backache and epidural receding Anesthetic complications: no    Last Vitals:  Vitals:   09/02/18 1355 09/02/18 1517  BP: 118/65 117/65  Pulse: 77 76  Resp: 18 18  Temp: 36.9 C 36.9 C  SpO2:  98%    Last Pain:  Vitals:   09/02/18 1804  TempSrc:   PainSc: 4    Pain Goal: Patients Stated Pain Goal: 3 (09/02/18 0155)                 Marrion Coy

## 2018-09-02 NOTE — Lactation Note (Signed)
This note was copied from a baby's chart. Lactation Consultation Note Baby 12 hrs old. Mom states baby is BF well. Mom has 33 yr old daughter that she didn't BF. Mom has large pendulous breast w/Rt. Nipple flat, everts well w/stimulation, very compressible. Mom states baby favors Rt. Breast. Lt. Nipple short shaft, very compressible. Mom states she has colostrum, can hand express. Mom asked a few questions then stated that she was good w/BF didn't need any assistance at this time. Newborn behavior, STS, I&O, cluster feeding, breast massage, supply and demand discussed. Lactation brochure left at bedside.  Patient Name: Bridget Summers [id number] Date: 09/02/2018 Reason for consult: Initial assessment;1st time breastfeeding   Maternal Data Has patient been taught Hand Expression?: Yes Does the patient have breastfeeding experience prior to this delivery?: No  Feeding Feeding Type: Breast Fed  LATCH Score       Type of Nipple: Everted at rest and after stimulation(short shaft/Rt. flat)  Comfort (Breast/Nipple): Soft / non-tender        Interventions Interventions: Breast feeding basics reviewed;Breast compression;Breast massage;Position options  Lactation Tools Discussed/Used WIC Program: Yes   Consult Status Consult Status: Follow-up Date: 09/03/18 Follow-up type: In-patient    Charyl Dancer 09/02/2018, 11:36 PM

## 2018-09-02 NOTE — Anesthesia Procedure Notes (Signed)
Epidural Patient location during procedure: OB Start time: 09/02/2018 2:20 AM End time: 09/02/2018 2:35 AM  Staffing Anesthesiologist: Elmer Picker, MD Performed: anesthesiologist   Preanesthetic Checklist Completed: patient identified, pre-op evaluation, timeout performed, IV checked, risks and benefits discussed and monitors and equipment checked  Epidural Patient position: sitting Prep: site prepped and draped and DuraPrep Patient monitoring: continuous pulse ox, blood pressure, heart rate and cardiac monitor Approach: midline Location: L3-L4 Injection technique: LOR air  Needle:  Needle type: Tuohy  Needle gauge: 17 G Needle length: 9 cm Needle insertion depth: 7 cm Catheter type: closed end flexible Catheter size: 19 Gauge Catheter at skin depth: 13 cm Test dose: negative  Assessment Sensory level: T8 Events: blood not aspirated, injection not painful, no injection resistance, negative IV test and no paresthesia  Additional Notes Patient identified. Risks/Benefits/Options discussed with patient including but not limited to bleeding, infection, nerve damage, paralysis, failed block, incomplete pain control, headache, blood pressure changes, nausea, vomiting, reactions to medication both or allergic, itching and postpartum back pain. Confirmed with bedside nurse the patient's most recent platelet count. Confirmed with patient that they are not currently taking any anticoagulation, have any bleeding history or any family history of bleeding disorders. Patient expressed understanding and wished to proceed. All questions were answered. Sterile technique was used throughout the entire procedure. Please see nursing notes for vital signs. Test dose was given through epidural catheter and negative prior to continuing to dose epidural or start infusion. Warning signs of high block given to the patient including shortness of breath, tingling/numbness in hands, complete motor block,  or any concerning symptoms with instructions to call for help. Patient was given instructions on fall risk and not to get out of bed. All questions and concerns addressed with instructions to call with any issues or inadequate analgesia.  Reason for block:procedure for pain

## 2018-09-03 LAB — CBC
HCT: 28.5 % — ABNORMAL LOW (ref 36.0–46.0)
Hemoglobin: 9 g/dL — ABNORMAL LOW (ref 12.0–15.0)
MCH: 21.8 pg — ABNORMAL LOW (ref 26.0–34.0)
MCHC: 31.6 g/dL (ref 30.0–36.0)
MCV: 69 fL — ABNORMAL LOW (ref 80.0–100.0)
Platelets: 182 10*3/uL (ref 150–400)
RBC: 4.13 MIL/uL (ref 3.87–5.11)
RDW: 17 % — ABNORMAL HIGH (ref 11.5–15.5)
WBC: 13 10*3/uL — ABNORMAL HIGH (ref 4.0–10.5)
nRBC: 0 % (ref 0.0–0.2)

## 2018-09-03 MED ORDER — BENZOCAINE-MENTHOL 20-0.5 % EX AERO: 1.0000 "application " | INHALATION_SPRAY | CUTANEOUS | 0 refills | Status: DC | PRN

## 2018-09-03 MED ORDER — IBUPROFEN 600 MG PO TABS: 600.0000 mg | ORAL_TABLET | Freq: Four times a day (QID) | ORAL | 0 refills | Status: DC

## 2018-09-03 MED ORDER — POLYSACCHARIDE IRON COMPLEX 150 MG PO CAPS: 150.0000 mg | ORAL_CAPSULE | Freq: Every day | ORAL | 2 refills | Status: DC

## 2018-09-03 MED ORDER — ACETAMINOPHEN 325 MG PO TABS: 650.0000 mg | ORAL_TABLET | ORAL | 0 refills | Status: DC | PRN

## 2018-09-03 MED ORDER — PRENATAL MULTIVITAMIN CH: 1.0000 | ORAL_TABLET | Freq: Every day | ORAL | 3 refills | Status: AC

## 2018-09-03 NOTE — Progress Notes (Signed)
Post Partum Day 1 Subjective: no complaints, up ad lib, voiding and tolerating PO. If baby can go home today patient desires discharge.   Objective: Vitals:   09/02/18 1517 09/02/18 1913 09/02/18 2336 09/03/18 0507  BP: 117/65 122/76 (!) 134/54 (!) 123/52  Pulse: 76 83 82 76  Resp: 18   18  Temp: 98.5 F (36.9 C) 98.4 F (36.9 C) 98.4 F (36.9 C) 98.4 F (36.9 C)  TempSrc: Oral Oral Oral Oral  SpO2: 98% 100%    Weight:      Height:        Physical Exam:  General: alert and cooperative Lochia: appropriate Uterine Fundus: firm Incision: healing well, no significant drainage, no dehiscence, no significant erythema DVT Evaluation: No evidence of DVT seen on physical exam. Negative Homan's sign. No cords or calf tenderness. No significant calf/ankle edema.  Recent Labs    09/02/18 0130  HGB 10.3*  HCT 32.9*    Assessment/Plan: Plan for discharge tomorrow and Breastfeeding   LOS: 1 day   Janeece Riggers 09/03/2018, 6:57 AM

## 2018-09-03 NOTE — Lactation Note (Signed)
This note was copied from a baby's chart. Lactation Consultation Note  Patient Name: Bridget Summers ESLPN'P Date: 09/03/2018 Reason for consult: Follow-up assessment;1st time breastfeeding;Term;Infant weight loss(3% weight loss / LC encouraged mom for feeding assessment  after she eats breakfast )  Mom desires early D/C and per mom will F/U tomorrow in [facility] office , waiting for her doctor for D/C.  Last attempted to feed at 9am and baby wasn't interested.  Per mom not sore, feels good about latching and hearing swallows.  LC reviewed sore nipple and engorgement prevention and tx .  Per mom watching the Breast feeding class online from [facility] and feels good  About the basics. Already has her DEBP .  LC discussed the importance of STS feedings until the baby is back to birth weight, gaining  Steadily and can stay awake for majority of feeding. Nutritive vs non- nutritive feeding patterns  And the importance of watching baby for hanging out latched. ( mom mentioned she had seen the  Baby already doing that). Reviewed how to safely take the baby off to decrease the potential for soreness.  Per mom already has her pump.  LC reviewed resources , the virtual BFSG and required sign up/ the [facility] breast feeding phone line,  LC O/P by appt./ and [place] [facility] ( mom active ).  Mom receptive to review.     Maternal Data Has patient been taught Hand Expression?: Yes(per mom comfortable with technique )  Feeding Feeding Type: (pe rmom last attempted at 9 am )  LATCH Score                   Interventions Interventions: Breast feeding basics reviewed  Lactation Tools Discussed/Used WIC Program: Yes(per mom viewed the online class ) Pump Review: Milk Storage Initiated by:: MAI  Date initiated:: 09/03/18   Consult Status Consult Status: Complete Date: 09/03/18    Bridget Summers 09/03/2018, 9:50 AM

## 2018-09-04 LAB — RPR: RPR Ser Ql: NONREACTIVE

## 2018-10-05 NOTE — Discharge Summary (Signed)
Obstetric Discharge Summary Reason for Admission: onset of labor Prenatal Procedures: none Intrapartum Procedures: spontaneous vaginal delivery Postpartum Procedures: none Complications-Operative and Postpartum: none Hemoglobin  Date Value Ref Range Status  09/03/2018 9.0 (L) 12.0 - 15.0 g/dL Final   HCT  Date Value Ref Range Status  09/03/2018 28.5 (L) 36.0 - 46.0 % Final    Physical Exam:  General: alert and cooperative Lochia: appropriate Uterine Fundus: firm Incision: na DVT Evaluation: Negative Homan's sign.  Discharge Diagnoses: Term Pregnancy-delivered  Discharge Information: Date: 10/05/2018 Activity:pelvic rest Diet: routine Medications: PNV, Ibuprofen and tylenol Condition: stable Instructions: refer to practice specific booklet Discharge to: home   Newborn Data: Live born female  Birth Weight: 8 lb 3.8 oz (3735 g) APGAR: 8, 9  Newborn Delivery   Birth date/time:  09/02/2018 11:36:00 Delivery type:  Vaginal, Vacuum (Extractor)     Home with mother.  Leomia Blake A Stephanine Reas 10/05/2018, 2:38 PM

## 2018-10-14 DIAGNOSIS — Z3040 Encounter for surveillance of contraceptives, unspecified: Secondary | ICD-10-CM | POA: Diagnosis not present

## 2019-05-21 NOTE — L&D Delivery Note (Addendum)
Delivery Note Non-Viable   Patient Name: Bridget Summers DOB: 02/15/1986 MRN: [id number]  Date of admission: 03/29/2020 Delivering MD: This patient has no babies on file. Date of delivery: 03/31/20 Type of delivery: SVD  Newborn Data: This patient has no babies on file.   [name], 34 y.o., @ [redacted]w[redacted]d,  [id number], who was admitted for Cervical incompetence with preterm labor and delivery with inevitable abortion at 20 weeks today. I was called to the room when she progressed +2 station in the second stage of labor and feeling pressure to push. At this time pt did not have an PIV, fentanyl IM ordered for pain. She did not push but fetal delivered during a cxt, unable to transfer to LD for pt requested epidural.  She delivered a viable infant, cephalic and en caul over an intact perineum.  A nuchal cord   was identified. The baby was placed on maternal abdomen after being wrapped in a blanket for maternal bonding. Cord double clamped and cut.  Cord cut by myself. Apgar scores were not taken due to non viable gestational age of [redacted] weeks, FHT were noted prior in the day of 157, but upon newborn being born there was no heart beat identified. A foul smell of amniotic fluid was noted after delivery. Prophylactic Pitocin was started in the third stage of labor for active management. The placenta did not delivered spontaneously, the umbilical cord was separated x2, maternal effort and fundal massage did not deliver the placenta, I had to manual remove the placenta and had a second sweep for fundal portion of the placenta and fragmented were delivered,  EBL was noted to be therefore TXA was hung and Unasyn 3gm was ordered, placenta delivered [place] style, with a 3 vessel cord and was sent to pathology, placenta appeared fragmented and not full with a pale look.  Inspection revealed none. An examination of the vaginal vault and cervix was free from lacerations. The uterus was firm, bleeding stable.   Umbilical artery blood gas were not sent.  Mom and baby left for bonding following delivery with good support of mother and aunt at bedside. Pt Left in stable condition. Autopsy offered, pt desires autopsy, chaplin offered pt denied. RN will attempt to make a foot or hand mole and continue comfort care for newborn. No fetal abnormalities were noted.   Maternal Info: Anesthesia: None Episiotomy: No Lacerations:  No Suture Repair: No Est. Blood Loss (mL):   Newborn Info:  Baby Sex: female Non viable delivery    Mom to remain in her room/Baby in mothers arms then will go for autopsy when pt request.   DR Richardson Dopp updated.   [place], [place], NP-C 03/31/20 12:09 AM

## 2019-08-13 DIAGNOSIS — Z6834 Body mass index (BMI) 34.0-34.9, adult: Secondary | ICD-10-CM | POA: Diagnosis not present

## 2019-08-13 DIAGNOSIS — Z3040 Encounter for surveillance of contraceptives, unspecified: Secondary | ICD-10-CM | POA: Diagnosis not present

## 2019-08-13 DIAGNOSIS — Z124 Encounter for screening for malignant neoplasm of cervix: Secondary | ICD-10-CM | POA: Diagnosis not present

## 2019-08-13 DIAGNOSIS — N898 Other specified noninflammatory disorders of vagina: Secondary | ICD-10-CM | POA: Diagnosis not present

## 2019-08-13 DIAGNOSIS — Z113 Encounter for screening for infections with a predominantly sexual mode of transmission: Secondary | ICD-10-CM | POA: Diagnosis not present

## 2019-08-13 DIAGNOSIS — Z0000 Encounter for general adult medical examination without abnormal findings: Secondary | ICD-10-CM | POA: Diagnosis not present

## 2019-08-13 DIAGNOSIS — N760 Acute vaginitis: Secondary | ICD-10-CM | POA: Diagnosis not present

## 2020-03-29 ENCOUNTER — Encounter (HOSPITAL_COMMUNITY): Payer: Self-pay | Admitting: Obstetrics and Gynecology

## 2020-03-29 ENCOUNTER — Inpatient Hospital Stay (HOSPITAL_COMMUNITY)
Admission: AD | Admit: 2020-03-29 | Discharge: 2020-04-01 | DRG: 779 | Disposition: A | Discharge status: Home / Self Care | Payer: Medicaid Other | Attending: Obstetrics & Gynecology | Admitting: Obstetrics & Gynecology

## 2020-03-29 ENCOUNTER — Inpatient Hospital Stay (HOSPITAL_BASED_OUTPATIENT_CLINIC_OR_DEPARTMENT_OTHER): Payer: Medicaid Other

## 2020-03-29 ENCOUNTER — Other Ambulatory Visit: Payer: Self-pay

## 2020-03-29 DIAGNOSIS — O4692 Antepartum hemorrhage, unspecified, second trimester: Secondary | ICD-10-CM | POA: Diagnosis not present

## 2020-03-29 DIAGNOSIS — D509 Iron deficiency anemia, unspecified: Secondary | ICD-10-CM | POA: Diagnosis present

## 2020-03-29 DIAGNOSIS — O9081 Anemia of the puerperium: Secondary | ICD-10-CM | POA: Diagnosis not present

## 2020-03-29 DIAGNOSIS — O26892 Other specified pregnancy related conditions, second trimester: Secondary | ICD-10-CM

## 2020-03-29 DIAGNOSIS — Z20822 Contact with and (suspected) exposure to covid-19: Secondary | ICD-10-CM | POA: Diagnosis not present

## 2020-03-29 DIAGNOSIS — O4690 Antepartum hemorrhage, unspecified, unspecified trimester: Secondary | ICD-10-CM

## 2020-03-29 DIAGNOSIS — Z3A20 20 weeks gestation of pregnancy: Secondary | ICD-10-CM | POA: Diagnosis not present

## 2020-03-29 DIAGNOSIS — O0932 Supervision of pregnancy with insufficient antenatal care, second trimester: Secondary | ICD-10-CM | POA: Diagnosis not present

## 2020-03-29 DIAGNOSIS — R102 Pelvic and perineal pain: Secondary | ICD-10-CM

## 2020-03-29 DIAGNOSIS — O039 Complete or unspecified spontaneous abortion without complication: Secondary | ICD-10-CM

## 2020-03-29 DIAGNOSIS — Z6790 Unspecified blood type, Rh positive: Secondary | ICD-10-CM

## 2020-03-29 DIAGNOSIS — Z3A19 19 weeks gestation of pregnancy: Secondary | ICD-10-CM

## 2020-03-29 DIAGNOSIS — O034 Incomplete spontaneous abortion without complication: Principal | ICD-10-CM | POA: Diagnosis present

## 2020-03-29 DIAGNOSIS — O3432 Maternal care for cervical incompetence, second trimester: Secondary | ICD-10-CM

## 2020-03-29 DIAGNOSIS — O0990 Supervision of high risk pregnancy, unspecified, unspecified trimester: Secondary | ICD-10-CM | POA: Diagnosis not present

## 2020-03-29 DIAGNOSIS — D62 Acute posthemorrhagic anemia: Secondary | ICD-10-CM | POA: Diagnosis not present

## 2020-03-29 HISTORY — DX: Chlamydial infection, unspecified: A74.9

## 2020-03-29 LAB — HEPATITIS B SURFACE ANTIGEN: Hepatitis B Surface Ag: NONREACTIVE

## 2020-03-29 LAB — URINALYSIS, ROUTINE W REFLEX MICROSCOPIC
Bilirubin Urine: NEGATIVE
Glucose, UA: NEGATIVE mg/dL
Ketones, ur: NEGATIVE mg/dL
Nitrite: NEGATIVE
Protein, ur: 30 mg/dL — AB
RBC / HPF: >50 RBC/hpf — ABNORMAL HIGH (ref 0–5)
Specific Gravity, Urine: 1.029 (ref 1.005–1.030)
pH: 5 (ref 5.0–8.0)

## 2020-03-29 LAB — WET PREP, GENITAL
Clue Cells Wet Prep HPF POC: NONE SEEN
Sperm: NONE SEEN
Trich, Wet Prep: NONE SEEN
Yeast Wet Prep HPF POC: NONE SEEN

## 2020-03-29 LAB — TYPE AND SCREEN
ABO/RH(D): A POS
Antibody Screen: NEGATIVE

## 2020-03-29 LAB — CBC
HCT: 28.7 % — ABNORMAL LOW (ref 36.0–46.0)
Hemoglobin: 8.8 g/dL — ABNORMAL LOW (ref 12.0–15.0)
MCH: 21.6 pg — ABNORMAL LOW (ref 26.0–34.0)
MCHC: 30.7 g/dL (ref 30.0–36.0)
MCV: 70.5 fL — ABNORMAL LOW (ref 80.0–100.0)
Platelets: 199 10*3/uL (ref 150–400)
RBC: 4.07 MIL/uL (ref 3.87–5.11)
RDW: 16.4 % — ABNORMAL HIGH (ref 11.5–15.5)
WBC: 10.7 10*3/uL — ABNORMAL HIGH (ref 4.0–10.5)
nRBC: 0 % (ref 0.0–0.2)

## 2020-03-29 LAB — RESPIRATORY PANEL BY RT PCR (FLU A&B, COVID)
Influenza A by PCR: NEGATIVE
Influenza B by PCR: NEGATIVE
SARS Coronavirus 2 by RT PCR: NEGATIVE

## 2020-03-29 LAB — HIV ANTIBODY (ROUTINE TESTING W REFLEX): HIV Screen 4th Generation wRfx: NONREACTIVE

## 2020-03-29 LAB — GROUP B STREP BY PCR: Group B strep by PCR: POSITIVE — AB

## 2020-03-29 MED ORDER — DOCUSATE SODIUM 100 MG PO CAPS
100.0000 mg | ORAL_CAPSULE | Freq: Every day | ORAL | Status: DC
  Administered 2020-03-30: 100 mg via ORAL
  Filled 2020-03-29: qty 1

## 2020-03-29 MED ORDER — PRENATAL MULTIVITAMIN CH
1.0000 | ORAL_TABLET | Freq: Every day | ORAL | Status: DC
  Administered 2020-03-30: 1 via ORAL
  Filled 2020-03-29: qty 1

## 2020-03-29 MED ORDER — ZOLPIDEM TARTRATE 5 MG PO TABS: 5.0000 mg | ORAL_TABLET | Freq: Every evening | ORAL | Status: DC | PRN

## 2020-03-29 MED ORDER — POLYSACCHARIDE IRON COMPLEX 150 MG PO CAPS
150.0000 mg | ORAL_CAPSULE | Freq: Every day | ORAL | Status: DC
  Administered 2020-03-30 – 2020-04-01 (×3): 150 mg via ORAL
  Filled 2020-03-29 (×3): qty 1

## 2020-03-29 MED ORDER — ACETAMINOPHEN 325 MG PO TABS
650.0000 mg | ORAL_TABLET | ORAL | Status: DC | PRN
  Administered 2020-03-30 (×2): 650 mg via ORAL
  Filled 2020-03-29 (×2): qty 2

## 2020-03-29 MED ORDER — CALCIUM CARBONATE ANTACID 500 MG PO CHEW: 2.0000 | CHEWABLE_TABLET | ORAL | Status: DC | PRN

## 2020-03-29 NOTE — MAU Note (Signed)
.   Bridget Summers is a 34 y.o. at [redacted]w[redacted]d here in MAU reporting: vaginal bleeding that started two nights ago after intercourse. Lower abdominal cramping which she describes is sharp shooting pain. Has appointment scheduled with [facility] LMP: 11/11/2019 Onset of complaint: 2 days Pain score: 3 Vitals:   03/29/20 1018  BP: 123/73  Pulse: (!) 108  Resp: 16  Temp: 98 F (36.7 C)  SpO2: 100%     FHT:154 Lab orders placed from triage: UA

## 2020-03-29 NOTE — H&P (Addendum)
OB ADMISSION/ HISTORY & PHYSICAL:  Admission Date: 03/29/2020 10:06 AM  Admit Diagnosis: Inevitable abortion  Bridget Summers is a 34 y.o. female [phone] [redacted]w[redacted]d by U/S. Uncertain LMP R/T oral POP and breastfeeding. Pt thought she was ~8 wks preg, non-medical [place] at [employer], in October, estimated gest @ 15 wks. Pt sched NOB appt w/ [facility], but cancelled due to work sched. Presented to MAU today for vaginal bleeding since 03/28/20 @ 2311. Last intercourse 03/27/20. Denies abd pain, cramping, LOF, and vaginal d/c. Denies ETOH, tobacco, and illicit drug use, takes no meds. 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 U/S, cultures, and labs collected in MAU. Hour-glassing membranes noted during SSE by MAU provider. MFM consult w/ Dr. Grace Bushy. Discussed admission to [facility] specialty and answered questions.     History of current pregnancy: [id number]   Significant prenatal events:  Patient Active Problem List   Diagnosis Date Noted  . Inevitable abortion 03/29/2020  . Other and unspecified ovarian cyst 07/23/2012    Prenatal Labs: ABO, Rh: --/--/A POS (11/10 1153) Antibody: NEG (11/10 1153) RPR:   pending HBsAg:   pending HIV:   pending GBS:   pending GC/CHL: pending Genetics: not performed    OB History  Gravida Para Term Preterm AB Living  4 2 2   1 2   SAB TAB Ectopic Multiple Live Births    1   0 2    # Outcome Date GA Lbr Len/2nd Weight Sex Delivery Anes PTL Lv  4 Current           3 Term 09/02/18 [redacted]w[redacted]d 14:20 / 00:16 3735 g M Vag-Vacuum EPI  LIV  2 TAB           1 Term      Vag-Spont       Medical / Surgical History: Past medical history:  Past Medical History:  Diagnosis Date  . Chlamydia   . Ovarian cyst     Past surgical history:  Past Surgical History:  Procedure Laterality Date  . INDUCED ABORTION    . NO PAST SURGERIES     Family History:  Family History  Problem Relation Age of Onset  . Diabetes Paternal Grandfather   . Stroke Paternal Grandfather   . Healthy  Mother   . Lupus Father   . Heart disease Maternal Grandmother   . Hypertension Maternal Grandmother     Social History:  reports that she has never smoked. She has never used smokeless tobacco. She reports previous alcohol use. She reports that she does not use drugs.  Allergies: Patient has no known allergies.   Current Medications at time of admission:  Prior to Admission medications   Medication Sig Start Date End Date Taking? Authorizing Provider  calcium carbonate (TUMS - DOSED IN MG ELEMENTAL CALCIUM) 500 MG chewable tablet Chew 1 tablet by mouth daily.   Yes [provider]  Prenatal Vit-Fe Fumarate-FA (PRENATAL MULTIVITAMIN) TABS tablet Take 1 tablet by mouth daily at 12 noon. 09/04/18  Yes Dillard, 09/06/18, MD  acetaminophen (TYLENOL) 325 MG tablet Take 2 tablets (650 mg total) by mouth every 4 (four) hours as needed (for pain scale < 4). 09/03/18   Dillard, Naima, MD  benzocaine-Menthol (DERMOPLAST) 20-0.5 % AERO Apply 1 application topically as needed for irritation (perineal discomfort). 09/03/18   09/05/18, MD  ibuprofen (ADVIL) 600 MG tablet Take 1 tablet (600 mg total) by mouth every 6 (six) hours. 09/03/18  Jaymes Graff, MD  iron polysaccharides (NIFEREX) 150 MG capsule Take 1 capsule (150 mg total) by mouth daily. 09/03/18   Jaymes Graff, MD    Review of Systems: Constitutional: Negative   HENT: Negative   Eyes: Negative   Respiratory: Negative   Cardiovascular: Negative   Gastrointestinal: Negative  Genitourinary: positive for vaginal bleeding, negative for LOF   Musculoskeletal: Negative   Skin: Negative   Neurological: Negative   Endo/Heme/Allergies: Negative   Psychiatric/Behavioral: Negative    Physical Exam: VS: Blood pressure 115/65, pulse 72, temperature 98 F (36.7 C), resp. rate 16, height 5\' 5"  (1.651 m), weight 100.2 kg, last menstrual period 11/11/2019, SpO2 100 %, unknown if currently breastfeeding. AAO x3, no signs of  distress Cardiovascular: RRR Respiratory: Lung fields clear to ausculation GU/GI: Abdomen gravid, non-tender, non-distended Extremities: no edema, negative for pain, tenderness, and cords  Cervical exam: deferred, hour-glassing membranes on SSE FHR: 154 per doppler TOCO: none   Prenatal Transfer Tool  Maternal Diabetes: No Genetic Screening: not performed, pt has not attended initial prenatal appt Maternal Ultrasounds/Referrals: Other:None Fetal Ultrasounds or other Referrals:  None Maternal Substance Abuse:  No Significant Maternal Medications:  None Significant Maternal Lab Results: Other: Prenatal labs pending    Assessment: 34 y.o. 20 [redacted]w[redacted]d  Inevitable abortion  Plan:  Admit to [facility] Prenatal labs MFM consult w/ Dr. [facility] U/S    -variable-breech position/posterior placenta/EFW 11 oz/312g (40%)    -no measurable cervix     -hour glassing membranes     Plan developed w/ Dr. Grace Bushy MSN, CNM 03/29/2020 1:28 PM  Called by Dr. 13/02/2020 of MFM with recommendations.  Plan expectant mgmt.  If bleeding and cramping ceases and pt is stable after 48hrs - 72hrs, pt may be managed outpt weekly.  No antibiotics or steroids until 23wks.

## 2020-03-29 NOTE — Consult Note (Signed)
MFM Consultation  Referring provider: Marylene Land RobertsMD Date of Service 03/29/20 Reason for request: Cervical insufficiency  Bridget Summers is a 34 yo G3P2 who is admitted to the hospital for likely preterm labor vs cervical insufficiency.   She notes that she felt pressure over the last few days with bleeding. She had intercourse two days prior. She was evaluated in the MAU and discovered membranes in the vagina.  She reports that she has had mild persistent bleeding and cramping.  She denied leakage of amniotic fluid.    Vitals with BMI 03/29/2020 03/29/2020 03/29/2020  Height - - 5\' 5"   Weight - - 221 lbs  BMI - - 36.78  Systolic 134 115  Diastolic 67 65 73  Pulse 77 72 108   CBC Latest Ref Rng & Units 03/29/2020 09/03/2018 09/02/2018  WBC 4.0 - 10.5 K/uL 10.7(H) 13.0(H) 10.7(H)  Hemoglobin 12.0 - 15.0 g/dL 09/04/2018) 8.1(W) 10.3(L)  Hematocrit 36 - 46 % 28.7(L) 28.5(L) 32.9(L)  Platelets 150 - 400 K/uL 199 182 205   CMP Latest Ref Rng & Units 09/02/2018 07/16/2018 07/14/2018  Glucose 70 - 99 mg/dL 76 88 07/16/2018)  BUN 6 - 20 mg/dL 7 5(L) 7  Creatinine 371(I - 1.00 mg/dL 9.67 8.93 8.10  Sodium 135 - 145 mmol/L 139 137 137  Potassium 3.5 - 5.1 mmol/L 3.4(L) 2.9(L) 2.7(LL)  Chloride 98 - 111 mmol/L 106 109 110  CO2 22 - 32 mmol/L 20(L) 22 21(L)  Calcium 8.9 - 10.3 mg/dL 9.2 1.75) 8.3(L)  Total Protein 6.5 - 8.1 g/dL 6.4(L) 5.5(L) 5.6(L)  Total Bilirubin 0.3 - 1.2 mg/dL 0.6 0.4 1.0(C)  Alkaline Phos 38 - 126 U/L 94 52 52  AST 15 - 41 U/L 20 15 16   ALT 0 - 44 U/L 17 14 16     OB History  Gravida Para Term Preterm AB Living  4 2 2  0 1 2  SAB TAB Ectopic Multiple Live Births  0 1 0 0 2    # Outcome Date GA Lbr Len/2nd Weight Sex Delivery Anes PTL Lv  4 Current           3 Term 09/02/18 [redacted]w[redacted]d 14:20 / 00:16 3735 g M Vag-Vacuum EPI  LIV     Name: Bridget Summers     Apgar1: 8  Apgar5: 9  2 TAB           1 Term      Vag-Spont      Past Medical History:  Diagnosis Date  .  Chlamydia   . Ovarian cyst    Past Surgical History:  Procedure Laterality Date  . INDUCED ABORTION    . NO PAST SURGERIES     Family History  Problem Relation Age of Onset  . Diabetes Paternal Grandfather   . Stroke Paternal Grandfather   . Healthy Mother   . Lupus Father   . Heart disease Maternal Grandmother   . Hypertension Maternal Grandmother    Social History   Socioeconomic History  . Marital status: Single    Spouse name: Not on file  . Number of children: Not on file  . Years of education: Not on file  . Highest education level: Not on file  Occupational History  . Occupation: Rep  Tobacco Use  . Smoking status: Never Smoker  . Smokeless tobacco: Never Used  Vaping Use  . Vaping Use: Never used  Substance and Sexual Activity  . Alcohol use: Not Currently  . Drug use: No  .  Sexual activity: Yes    Birth control/protection: None  Other Topics Concern  . Not on file  Social History Narrative  . Not on file   Social Determinants of Health   Financial Resource Strain:   . Difficulty of Paying Living Expenses: Not on file  Food Insecurity:   . Worried About Programme researcher, broadcasting/film/video in the Last Year: Not on file  . Ran Out of Food in the Last Year: Not on file  Transportation Needs:   . Lack of Transportation (Medical): Not on file  . Lack of Transportation (Non-Medical): Not on file  Physical Activity:   . Days of Exercise per Week: Not on file  . Minutes of Exercise per Session: Not on file  Stress:   . Feeling of Stress : Not on file  Social Connections:   . Frequency of Communication with Friends and Family: Not on file  . Frequency of Social Gatherings with Friends and Family: Not on file  . Attends Religious Services: Not on file  . Active Member of Clubs or Organizations: Not on file  . Attends Banker Meetings: Not on file  . Marital Status: Not on file  Intimate Partner Violence:   . Fear of Current or Ex-Partner: Not on  file  . Emotionally Abused: Not on file  . Physically Abused: Not on file  . Sexually Abused: Not on file   Imaging: 03/29/20 Demonstrates fetal membranes in the vagina Normal anatomy consistent with dates. Please see report for details.    Impression/Counseling:  Traditionally, cervical insufficiency has been defined by four historical criteria: (1) painless cervical dilation, leading to (2) recurrent, (3) second trimester births in the (4) absence of other cause. However, this traditional criteria does not account for those women with structural cervical weakness.   In [name]'s case she presents with possible uterine contractions vs painless cervical dilation. She has fetal membranes in the vagina with no measurable cervix. I discussed the increased risk for membrane rupture and also the possibility of no cervical length even after conservative treatment and trendelenberg.  I discussed the concepts of expectant management and cerclage placement.  At this time I don't recommend a cerclage and that expectant management was offered. I discussed primary reasons is that she continues to bleed and contract. In addition, with membranes in the cervix there is a high likely of membrane rupture if a cerclage attempt was made. At this time we didn't identify measurable cervix.   Secondly, we discussed the increased risk for likely preterm previable delivery, and the complications associated to include decreased survival and neurologic impairment.   Thirdly, we discussed the increased risk for bleeding and infection. She expressed a desire for expectant management and that if she remains stable to be discharged with intensive outpatient management, with a plan for steroids and readmission at 23-24 weeks.   I discussed the increased risk for intraamniotic infection in the first 48 hours leading to delivery or within 4 weeks. We commonly observe this to be a significant cause for delivery. We also  discussed the signs and symptoms of infection including bleeding, fever and uterine tenderness.  Ms. Szeto expressed an understanding and confirmed her desire for expectant management at this time.   I spent 45 minutes with > 50% in face to face consultation  I discussed today's case with Dr. Hillis Range, MD.

## 2020-03-29 NOTE — MAU Provider Note (Signed)
History     CSN: [id number]  Arrival date and time: 03/29/20 1006   First Provider Initiated Contact with Patient 03/29/20 1136      Chief Complaint  Patient presents with  . Vaginal Bleeding   [name] is a 34 y.o. [id number] at [redacted]w[redacted]d who presents to MAU for vaginal bleeding x2 days. Patient reports she had intercourse 2 days ago and since that time has been bleeding heavily. She denies soaking pads, but reports she does need to change them multiple times throughout the day. Patient reports minimal cramping and denies contraction-like pain. Patient reports she has not felt like there was something between her legs until today.  Pt denies VB, LOF, ctx, vaginal discharge/odor/itching. Pt denies N/V, abdominal pain, constipation, diarrhea, or urinary problems. Pt denies fever, chills, fatigue, sweating or changes in appetite. Pt denies SOB or chest pain. Pt denies dizziness, HA, light-headedness, weakness.  Problems this pregnancy include: pt has not yet been seen. Allergies? NKDA Current medications/supplements? PNVs Prenatal care provider? [facility], first appt next week   OB History    Gravida  4   Para  2   Term  2   Preterm      AB  1   Living  2     SAB      TAB  1   Ectopic      Multiple  0   Live Births  2           Past Medical History:  Diagnosis Date  . Chlamydia   . Ovarian cyst     Past Surgical History:  Procedure Laterality Date  . INDUCED ABORTION    . NO PAST SURGERIES      Family History  Problem Relation Age of Onset  . Diabetes Paternal Grandfather   . Stroke Paternal Grandfather   . Healthy Mother   . Lupus Father   . Heart disease Maternal Grandmother   . Hypertension Maternal Grandmother     Social History   Tobacco Use  . Smoking status: Never Smoker  . Smokeless tobacco: Never Used  Vaping Use  . Vaping Use: Never used  Substance Use Topics  . Alcohol use: Not Currently  . Drug use: No    Allergies:  No Known Allergies  Medications Prior to Admission  Medication Sig Dispense Refill Last Dose  . calcium carbonate (TUMS - DOSED IN MG ELEMENTAL CALCIUM) 500 MG chewable tablet Chew 1 tablet by mouth daily.   03/28/2020 at Unknown time  . Prenatal Vit-Fe Fumarate-FA (PRENATAL MULTIVITAMIN) TABS tablet Take 1 tablet by mouth daily at 12 noon. 30 tablet 3 03/29/2020 at Unknown time  . acetaminophen (TYLENOL) 325 MG tablet Take 2 tablets (650 mg total) by mouth every 4 (four) hours as needed (for pain scale < 4). 30 tablet 0 More than a month at Unknown time  . benzocaine-Menthol (DERMOPLAST) 20-0.5 % AERO Apply 1 application topically as needed for irritation (perineal discomfort). 1 each 0   . ibuprofen (ADVIL) 600 MG tablet Take 1 tablet (600 mg total) by mouth every 6 (six) hours. 30 tablet 0   . iron polysaccharides (NIFEREX) 150 MG capsule Take 1 capsule (150 mg total) by mouth daily. 30 capsule 2     Review of Systems  Constitutional: Negative for chills, diaphoresis, fatigue and fever.  Eyes: Negative for visual disturbance.  Respiratory: Negative for shortness of breath.   Cardiovascular: Negative for chest pain.  Gastrointestinal: Negative for abdominal pain,  constipation, diarrhea, nausea and vomiting.  Genitourinary: Positive for vaginal bleeding. Negative for dysuria, flank pain, frequency, pelvic pain, urgency and vaginal discharge.  Neurological: Negative for dizziness, weakness, light-headedness and headaches.   Physical Exam   Blood pressure 115/65, pulse 72, temperature 98 F (36.7 C), resp. rate 16, height 5\' 5"  (1.651 m), weight 100.2 kg, last menstrual period 11/11/2019, SpO2 100 %, unknown if currently breastfeeding.  Patient Vitals for the past 24 hrs:  BP Temp Pulse Resp SpO2 Height Weight  03/29/20 1239 115/65 -- 72 -- -- -- --  03/29/20 1018 123/73 98 F (36.7 C) (!) 108 16 100 % 5\' 5"  (1.651 m) 100.2 kg    Physical Exam Vitals and nursing note reviewed. Exam  conducted with a chaperone present.  Constitutional:      General: She is not in acute distress.    Appearance: Normal appearance. She is not ill-appearing, toxic-appearing or diaphoretic.  HENT:     Head: Normocephalic and atraumatic.  Pulmonary:     Effort: Pulmonary effort is normal.  Abdominal:     Palpations: Abdomen is soft.  Genitourinary:    General: Normal vulva.     Labia:        Right: No rash, tenderness or lesion.        Left: No rash, tenderness or lesion.      Vagina: Bleeding present. No vaginal discharge or tenderness.     Cervix: Dilated.     Comments: On visual exam, os dilated about 2cm. Bulging membranes present at os. Small blood clots noted, minimal active bleeding noted. No digital exam performed. Neurological:     Mental Status: She is alert and oriented to person, place, and time.  Psychiatric:        Mood and Affect: Mood normal.        Behavior: Behavior normal.        Thought Content: Thought content normal.        Judgment: Judgment normal.    Results for orders placed or performed during the hospital encounter of 03/29/20 (from the past 24 hour(s))  Urinalysis, Routine w reflex microscopic Urine, Clean Catch     Status: Abnormal   Collection Time: 03/29/20 11:11 AM  Result Value Ref Range   Color, Urine AMBER (A) YELLOW   APPearance CLOUDY (A) CLEAR   Specific Gravity, Urine 1.029 1.005 - 1.030   pH 5.0 5.0 - 8.0   Glucose, UA NEGATIVE NEGATIVE mg/dL   Hgb urine dipstick LARGE (A) NEGATIVE   Bilirubin Urine NEGATIVE NEGATIVE   Ketones, ur NEGATIVE NEGATIVE mg/dL   Protein, ur 30 (A) NEGATIVE mg/dL   Nitrite NEGATIVE NEGATIVE   Leukocytes,Ua SMALL (A) NEGATIVE   RBC / HPF >50 (H) 0 - 5 RBC/hpf   WBC, UA 21-50 0 - 5 WBC/hpf   Bacteria, UA FEW (A) NONE SEEN   Squamous Epithelial / LPF 11-20 0 - 5   Mucus PRESENT   Wet prep, genital     Status: Abnormal   Collection Time: 03/29/20 11:37 AM  Result Value Ref Range   Yeast Wet Prep HPF POC  NONE SEEN NONE SEEN   Trich, Wet Prep NONE SEEN NONE SEEN   Clue Cells Wet Prep HPF POC NONE SEEN NONE SEEN   WBC, Wet Prep HPF POC MODERATE (A) NONE SEEN   Sperm NONE SEEN   CBC     Status: Abnormal   Collection Time: 03/29/20 11:53 AM  Result Value Ref Range  WBC 10.7 (H) 4.0 - 10.5 K/uL   RBC 4.07 3.87 - 5.11 MIL/uL   Hemoglobin 8.8 (L) 12.0 - 15.0 g/dL   HCT 58.8 (L) 36 - 46 %   MCV 70.5 (L) 80.0 - 100.0 fL   MCH 21.6 (L) 26.0 - 34.0 pg   MCHC 30.7 30.0 - 36.0 g/dL   RDW 50.2 (H) 77.4 - 12.8 %   Platelets 199 150 - 400 K/uL   nRBC 0.0 0.0 - 0.2 %   No results found.  MAU Course  Procedures  MDM -pt at 19, almost 20 weeks with VB x2days -membranes visualized on exam -US shows hourglass membranes in vagina, live fetus -minimal active bleeding on exam -Dr. Su Hilt notified, will admit pt -V. [place] to bedside to see patient, notified of CBC results -admit  Orders Placed This Encounter  Procedures  . Wet prep, genital    Standing Status:   Standing    Number of Occurrences:   1  . [place] MFM OB DETAIL +14 WK    Standing Status:   Standing    Number of Occurrences:   1    Order Specific Question:   Symptom/Reason for Exam    Answer:   Vaginal bleeding in pregnancy [[id number]]  . Urinalysis, Routine w reflex microscopic Urine, Clean Catch    Standing Status:   Standing    Number of Occurrences:   1  . CBC    Standing Status:   Standing    Number of Occurrences:   1   No orders of the defined types were placed in this encounter.   Assessment and Plan   1. Inevitable complete miscarriage without complication   2. Vaginal bleeding in pregnancy   3. Blood type, Rh positive   4. [redacted] weeks gestation of pregnancy    -admit  Odie Sera Jasaun Carn 03/29/2020, 1:17 PM

## 2020-03-29 NOTE — MAU Note (Signed)
Mild cramping, feels like period is coming. Just changed pad, 5 th pad she had used, not soaked through, but enough to warrant a change. Not actively bleeding currently, clean pad in place.

## 2020-03-30 DIAGNOSIS — R102 Pelvic and perineal pain: Secondary | ICD-10-CM | POA: Diagnosis not present

## 2020-03-30 LAB — URINE CULTURE: Culture: >=100000 — AB

## 2020-03-30 LAB — CBC
HCT: 32.4 % — ABNORMAL LOW (ref 36.0–46.0)
Hemoglobin: 9.7 g/dL — ABNORMAL LOW (ref 12.0–15.0)
MCH: 21 pg — ABNORMAL LOW (ref 26.0–34.0)
MCHC: 29.9 g/dL — ABNORMAL LOW (ref 30.0–36.0)
MCV: 70.3 fL — ABNORMAL LOW (ref 80.0–100.0)
Platelets: 221 10*3/uL (ref 150–400)
RBC: 4.61 MIL/uL (ref 3.87–5.11)
RDW: 16.2 % — ABNORMAL HIGH (ref 11.5–15.5)
WBC: 14 10*3/uL — ABNORMAL HIGH (ref 4.0–10.5)
nRBC: 0 % (ref 0.0–0.2)

## 2020-03-30 LAB — GC/CHLAMYDIA PROBE AMP (~~LOC~~) NOT AT ARMC
Chlamydia: NEGATIVE
Comment: NEGATIVE
Comment: NORMAL
Neisseria Gonorrhea: NEGATIVE

## 2020-03-30 LAB — RPR: RPR Ser Ql: NONREACTIVE

## 2020-03-30 MED ORDER — LACTATED RINGERS IV SOLN: Freq: Once | INTRAVENOUS | Status: AC

## 2020-03-30 MED ORDER — OXYCODONE HCL 5 MG PO TABS
5.0000 mg | ORAL_TABLET | ORAL | Status: DC | PRN
  Administered 2020-03-30 – 2020-03-31 (×2): 5 mg via ORAL
  Filled 2020-03-30 (×2): qty 1

## 2020-03-30 MED ORDER — FENTANYL CITRATE (PF) 100 MCG/2ML IJ SOLN
100.0000 ug | Freq: Once | INTRAMUSCULAR | Status: AC
  Administered 2020-03-30: 100 ug via INTRAMUSCULAR

## 2020-03-30 MED ORDER — TRANEXAMIC ACID-NACL 1000-0.7 MG/100ML-% IV SOLN
1000.0000 mg | Freq: Once | INTRAVENOUS | Status: AC
  Administered 2020-03-31: 1000 mg via INTRAVENOUS
  Filled 2020-03-30: qty 100

## 2020-03-30 MED ORDER — OXYTOCIN-SODIUM CHLORIDE 30-0.9 UT/500ML-% IV SOLN: 2.5000 [IU]/h | INTRAVENOUS | Status: DC

## 2020-03-30 MED ORDER — FENTANYL-BUPIVACAINE-NACL 0.5-0.125-0.9 MG/250ML-% EP SOLN
EPIDURAL | Status: AC
  Filled 2020-03-30: qty 250

## 2020-03-30 MED ORDER — FENTANYL CITRATE (PF) 100 MCG/2ML IJ SOLN
100.0000 ug | Freq: Once | INTRAMUSCULAR | Status: AC
  Filled 2020-03-30: qty 2

## 2020-03-30 MED ORDER — OXYTOCIN-SODIUM CHLORIDE 30-0.9 UT/500ML-% IV SOLN
INTRAVENOUS | Status: AC
  Administered 2020-03-30: 2.5 [IU]/h via INTRAVENOUS
  Filled 2020-03-30: qty 500

## 2020-03-30 MED ORDER — FENTANYL CITRATE (PF) 100 MCG/2ML IJ SOLN: 50.0000 ug | Freq: Once | INTRAMUSCULAR | Status: AC

## 2020-03-30 MED ORDER — FENTANYL CITRATE (PF) 100 MCG/2ML IJ SOLN
INTRAMUSCULAR | Status: AC
  Filled 2020-03-30: qty 2

## 2020-03-30 MED ORDER — MAGNESIUM OXIDE 400 (241.3 MG) MG PO TABS
400.0000 mg | ORAL_TABLET | Freq: Every day | ORAL | Status: DC
  Administered 2020-03-30 – 2020-04-01 (×3): 400 mg via ORAL
  Filled 2020-03-30 (×3): qty 1

## 2020-03-30 MED ORDER — PHENYLEPHRINE 40 MCG/ML (10ML) SYRINGE FOR IV PUSH (FOR BLOOD PRESSURE SUPPORT)
PREFILLED_SYRINGE | INTRAVENOUS | Status: AC
  Filled 2020-03-30: qty 10

## 2020-03-30 NOTE — Progress Notes (Signed)
S: RN called for pt feeling cxt and pressure to push. Increased vaginal bleeding.  O: BP (!) 102/49 (BP Location: Right Arm)   Pulse 94   Temp 99.1 F (37.3 C) (Oral)   Resp 18   Ht 5\' 5"  (1.651 m)   Wt 100.2 kg   LMP 11/11/2019   SpO2 100%   BMI 36.78 kg/m  A/P: Transferor to LD, pt in labor, increased vaginal bleeding, and cramping, tylenol did not help, IM fentanyl ordered and PIV ordered.

## 2020-03-30 NOTE — Progress Notes (Signed)
Checking in on pt. Pt resting. Update provided by RN: Scant bleeding on peripad, pt desires to remain in trendelenberg ~ 15 degree angle, denies abd pain, pressure, and LOF. FHR 150 per doppler.    Rhea Pink, MSN, CNM 03/30/2020

## 2020-03-30 NOTE — Progress Notes (Signed)
Hospital day # 1 pregnancy at [redacted]w[redacted]d--Inevitable abortion.  S:  Assumed care of pt, pt in NAD resting well. Reviewed plan of care, pt denies questions. Pt denies SOB, CP, dizziness, fatigue, or heart palpitations. +FM. Endorses light vaginal bleeding over night, last bleeding was now light on pad. Pt hopefull she will make it to viability.        Perception of contractions: mild menstrual cramps, not strong, no regularity noted.      Vaginal bleeding: lighter than period       Vaginal discharge:  no significant change  O: BP (!) 110/57 (BP Location: Right Arm)   Pulse 84   Temp 98.8 F (37.1 C) (Oral)   Resp 17   Ht 5\' 5"  (1.651 m)   Wt 100.2 kg   LMP 11/11/2019   SpO2 99%   BMI 36.78 kg/m       Fetal Hear Tones: 157      Contractions:   None, mild menstrual cramps      Uterus gravid and non-tender      Extremities: extremities normal, atraumatic, no cyanosis or edema and no significant edema and no signs of DVT          Labs:  GC/C pending. GBS+. Wet prep with WBC. UC pending. HIV-. RPR NR. Hep B -. Covid -.        Meds: PNV, Niferex daily with magnesium PO. Tylenol for pain, Ambien for sleep, stool softer PRN.   A: [redacted]w[redacted]d with LOS#1 admitted on 11/10 at [redacted]w[redacted]d for inevitable abortion.   P: Continue current plan of care   Inevitable Abortion: Unchanged, [redacted]w[redacted]d [redacted]w[redacted]d by U/S. Uncertain LMP R/T oral POP and breastfeeding. Pt thought she was ~8 wks preg, non-medical [redacted]w[redacted]d at [place], in October, estimated gest @ 15 wks. Pt sched NOB appt w/ [facility], but cancelled due to work sched. Presented to MAU today for vaginal bleeding since 03/28/20 @ 2311. Last intercourse 03/27/20. Denies abd pain, LOF, and vaginal d/c. Denies ETOH, tobacco, and illicit drug use, takes no meds. 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 U/S, cultures, and labs collected in MAU and resulted WNL, except cbc with noted anemia 8.8 and gbs+. Hour-glassing membranes noted during SSE by MAU provider, no cervix noted on  2010. Over night pt still having light menstral bleeding and mild menstrual cramps. MFM consult w/ Dr. [place] recommends expectant managment, in pt until 48 hours with no vaginal bleeding, then weekly visit out pt, no BMZ or abx until 23 weeks is indicated. GC/C pending. Pt on moderate bedrest with bathroom privileges.   Anemia: Asymptomatic, stable, admitting hgb 9.9, start iron PO with magnesium PO today.   FWB: FHT 157 today, Grace Bushy 11/10 EFW 11oz, breech, posterior placenta. Plan for Qshift FHT. BMZ at 23 weeks.   [facility] CNM, MSN 03/30/2020 10:17 AM

## 2020-03-30 NOTE — Progress Notes (Addendum)
Patient ID: Bridget Summers, female   DOB: Dec 01, 1985, 34 y.o.   MRN: [id number] Called to see patient for c/o increased pain and bleeding Patient is [redacted]w[redacted]d with cervical dilation and hourglassing membranes  Admitted yesterday with bleeding and pain. MFM was consulted and they recommended expectant management Patient has been in Trendelenberg position and is currently on hands/knees States feels only a little rectal pressure She received a single Percocet at 2102  Consulted Dr Donavan Foil who recommends avoiding digital exam right now unless fetal parts are presenting.  He states we could do a SSE, but pt declines. No bulging of perineum noted.   There is watery blood dripping onto bed Patient declines return to supine Trendelenberg position She declines exam for now  I will order an IV fluid bolus (currently does not have IV access) and of Fentanyl  [facility] provider is on the way in.

## 2020-03-31 ENCOUNTER — Encounter (HOSPITAL_COMMUNITY): Payer: Self-pay

## 2020-03-31 LAB — CBC
HCT: 25.6 % — ABNORMAL LOW (ref 36.0–46.0)
Hemoglobin: 7.9 g/dL — ABNORMAL LOW (ref 12.0–15.0)
MCH: 21.4 pg — ABNORMAL LOW (ref 26.0–34.0)
MCHC: 30.9 g/dL (ref 30.0–36.0)
MCV: 69.4 fL — ABNORMAL LOW (ref 80.0–100.0)
Platelets: 208 10*3/uL (ref 150–400)
RBC: 3.69 MIL/uL — ABNORMAL LOW (ref 3.87–5.11)
RDW: 16.1 % — ABNORMAL HIGH (ref 11.5–15.5)
WBC: 19.8 10*3/uL — ABNORMAL HIGH (ref 4.0–10.5)
nRBC: 0 % (ref 0.0–0.2)

## 2020-03-31 MED ORDER — ONDANSETRON HCL 4 MG/2ML IJ SOLN: 4.0000 mg | INTRAMUSCULAR | Status: DC | PRN

## 2020-03-31 MED ORDER — POLYSACCHARIDE IRON COMPLEX 150 MG PO CAPS: 150.0000 mg | ORAL_CAPSULE | Freq: Every day | ORAL | Status: DC

## 2020-03-31 MED ORDER — DIPHENHYDRAMINE HCL 25 MG PO CAPS: 25.0000 mg | ORAL_CAPSULE | Freq: Four times a day (QID) | ORAL | Status: DC | PRN

## 2020-03-31 MED ORDER — PRENATAL MULTIVITAMIN CH
1.0000 | ORAL_TABLET | Freq: Every day | ORAL | Status: DC
  Administered 2020-03-31 – 2020-04-01 (×2): 1 via ORAL
  Filled 2020-03-31 (×2): qty 1

## 2020-03-31 MED ORDER — DIBUCAINE (PERIANAL) 1 % EX OINT: 1.0000 "application " | TOPICAL_OINTMENT | CUTANEOUS | Status: DC | PRN

## 2020-03-31 MED ORDER — COCONUT OIL OIL: 1.0000 "application " | TOPICAL_OIL | Status: DC | PRN

## 2020-03-31 MED ORDER — WITCH HAZEL-GLYCERIN EX PADS: 1.0000 "application " | MEDICATED_PAD | CUTANEOUS | Status: DC | PRN

## 2020-03-31 MED ORDER — SODIUM CHLORIDE 0.9 % IV SOLN
3.0000 g | Freq: Four times a day (QID) | INTRAVENOUS | Status: AC
  Administered 2020-03-31 – 2020-04-01 (×4): 3 g via INTRAVENOUS
  Filled 2020-03-31 (×4): qty 8

## 2020-03-31 MED ORDER — ACETAMINOPHEN 325 MG PO TABS: 650.0000 mg | ORAL_TABLET | ORAL | Status: DC | PRN

## 2020-03-31 MED ORDER — SODIUM CHLORIDE 0.9 % IV SOLN
510.0000 mg | Freq: Once | INTRAVENOUS | Status: AC
  Administered 2020-03-31: 510 mg via INTRAVENOUS
  Filled 2020-03-31: qty 17

## 2020-03-31 MED ORDER — BENZOCAINE-MENTHOL 20-0.5 % EX AERO: 1.0000 "application " | INHALATION_SPRAY | CUTANEOUS | Status: DC | PRN

## 2020-03-31 MED ORDER — IBUPROFEN 600 MG PO TABS
600.0000 mg | ORAL_TABLET | Freq: Four times a day (QID) | ORAL | Status: DC
  Administered 2020-03-31 – 2020-04-01 (×6): 600 mg via ORAL
  Filled 2020-03-31 (×6): qty 1

## 2020-03-31 MED ORDER — SIMETHICONE 80 MG PO CHEW: 80.0000 mg | CHEWABLE_TABLET | ORAL | Status: DC | PRN

## 2020-03-31 MED ORDER — ONDANSETRON HCL 4 MG PO TABS: 4.0000 mg | ORAL_TABLET | ORAL | Status: DC | PRN

## 2020-03-31 MED ORDER — SENNOSIDES-DOCUSATE SODIUM 8.6-50 MG PO TABS
2.0000 | ORAL_TABLET | ORAL | Status: DC
  Administered 2020-04-01: 2 via ORAL
  Filled 2020-03-31: qty 2

## 2020-03-31 MED ORDER — TETANUS-DIPHTH-ACELL PERTUSSIS 5-2.5-18.5 LF-MCG/0.5 IM SUSY: 0.5000 mL | PREFILLED_SYRINGE | Freq: Once | INTRAMUSCULAR | Status: DC

## 2020-03-31 MED ORDER — SODIUM CHLORIDE 0.9 % IV SOLN
3.0000 g | Freq: Once | INTRAVENOUS | Status: AC
  Administered 2020-03-31: 3 g via INTRAVENOUS
  Filled 2020-03-31: qty 8

## 2020-03-31 MED ORDER — ZOLPIDEM TARTRATE 5 MG PO TABS: 5.0000 mg | ORAL_TABLET | Freq: Every evening | ORAL | Status: DC | PRN

## 2020-03-31 NOTE — Progress Notes (Signed)
I offered support throughout the day to Bridget Summers and her family as they grieve and begin to wrap their minds around the loss of [place].  They requested a service and a baptism which we were able to do with her parents, brother, and older children present.  I was accompanied by Paul Half for the baptism.

## 2020-03-31 NOTE — Progress Notes (Signed)
Delivery Summary completed by Sharon Seller, RNC with information obtained from Rondel Jumbo, RN (the patients delivery RN).

## 2020-03-31 NOTE — Progress Notes (Addendum)
PPD # 1 S/P NSVD, PTB/fetal demise/cervical insufficiency/chorio 20 wks   Delivering provider: Dale [place], CNM Episiotomy:none Lacerations:None Pain control at delivery: none  S:  Reports feeling well this morning. Appropriately sad, teary when talking about baby. Desires autopsy on baby.  Plans inpatient stay until tomorrow.   [facility] at [facility] in open crib.              Tolerating po/ No nausea or vomiting             Bleeding is light             Pain controlled with ibuprofen (OTC)             Up ad lib / ambulatory / voiding without difficulties   O:  A & O x 3, in no apparent distress              VS:  Vitals:   03/31/20 0145 03/31/20 0200 03/31/20 0458 03/31/20 0739  BP: 116/67 (!) 120/58 (!) 105/55 (!) 96/54  Pulse: 92 95 85 78  Resp: 18  18 18   Temp:  98.4 F (36.9 C) 98.1 F (36.7 C) 98.1 F (36.7 C)  TempSrc:  Oral Oral Oral  SpO2: 100%  98% 100%  Weight:      Height:        LABS:  Recent Labs    03/30/20 2230 03/31/20 0609  WBC 14.0* 19.8*  HGB 9.7* 7.9*  HCT 32.4* 25.6*  PLT 221 208    Blood type: --/--/A POS (11/10 1153)  Rubella:   unknown  I&O: I/O last 3 completed shifts: In: -  Out: 1550 [Urine:500; Blood:1050]          No intake/output data recorded.    Gen: AAO x 3, NAD  Abdomen: soft, non-tender, non-distended             Fundus: firm, non-tender, U non palp  Perineum: intact, foul odor  Lochia: small, no clots  Extremities: no edema, no calf pain or tenderness    A/P: PPD # 1 34 y.o., 07-02-2004   Active Problems:   SVD (spontaneous vaginal delivery)   IDA (iron deficiency anemia) with superimposed ABL  - asymptomatic  - IV feraheme today then start oral fe supplement   Preterm labor in second trimester with preterm delivery 20.0 wks   Manual placenta  - s/p Unasyn x 1 dose and TXA  - afebrile, no tenderness but noted leukocytosis  - no further bleeding overnight, appropriate lochia Suspect chorio --> preterm birth  - no vaginal  cultures prior to delivery, at this point too much blood contaminant to be valid for cultures  - wet prep done and negative for clue cells  - monitor closely for endometritis  - resume Unasyn 3 gm q 6 hrs x 24 hours prophylactic Grief / perinatal loss  - consult to chaplin pending. Continue inpatient, routine PP orders.   POC in consult w/ Dr. [id number], MSN, CNM 03/31/2020, 10:11 AM

## 2020-04-01 LAB — CBC WITH DIFFERENTIAL/PLATELET
Abs Immature Granulocytes: 0.08 10*3/uL — ABNORMAL HIGH (ref 0.00–0.07)
Basophils Absolute: 0.1 10*3/uL (ref 0.0–0.1)
Basophils Relative: 0 %
Eosinophils Absolute: 0.1 10*3/uL (ref 0.0–0.5)
Eosinophils Relative: 0 %
HCT: 25.2 % — ABNORMAL LOW (ref 36.0–46.0)
Hemoglobin: 7.9 g/dL — ABNORMAL LOW (ref 12.0–15.0)
Immature Granulocytes: 1 %
Lymphocytes Relative: 21 %
Lymphs Abs: 2.3 10*3/uL (ref 0.7–4.0)
MCH: 22.1 pg — ABNORMAL LOW (ref 26.0–34.0)
MCHC: 31.3 g/dL (ref 30.0–36.0)
MCV: 70.6 fL — ABNORMAL LOW (ref 80.0–100.0)
Monocytes Absolute: 0.4 10*3/uL (ref 0.1–1.0)
Monocytes Relative: 4 %
Neutro Abs: 8.3 10*3/uL — ABNORMAL HIGH (ref 1.7–7.7)
Neutrophils Relative %: 74 %
Platelets: 197 10*3/uL (ref 150–400)
RBC: 3.57 MIL/uL — ABNORMAL LOW (ref 3.87–5.11)
RDW: 16.5 % — ABNORMAL HIGH (ref 11.5–15.5)
WBC: 11.2 10*3/uL — ABNORMAL HIGH (ref 4.0–10.5)
nRBC: 0 % (ref 0.0–0.2)

## 2020-04-01 MED ORDER — IBUPROFEN 600 MG PO TABS
600.0000 mg | ORAL_TABLET | Freq: Four times a day (QID) | ORAL | 0 refills | Status: DC
Start: 2020-04-01 — End: 2021-09-03

## 2020-04-01 MED ORDER — MAGNESIUM OXIDE 400 (241.3 MG) MG PO TABS
400.0000 mg | ORAL_TABLET | Freq: Every day | ORAL | Status: DC
Start: 2020-04-02 — End: 2021-09-03

## 2020-04-01 NOTE — Discharge Summary (Addendum)
Postpartum Discharge Summary  Date of Service updated 04/01/20     Patient Name: Bridget Summers DOB: 12-Sep-1985 MRN: [id number]  Date of admission: 03/29/2020 Delivery date:03/30/2020  Delivering provider: Noralyn Pick  Date of discharge: 04/01/2020  Admitting diagnosis: Preterm labor [O60.00] Intrauterine pregnancy: [redacted]w[redacted]d    Secondary diagnosis:  Active Problems:   Inevitable abortion   SVD (spontaneous vaginal delivery)   IDA (iron deficiency anemia)   Preterm labor in second trimester with preterm delivery 20.0 wks  Additional problems: none    Discharge diagnosis: Preterm Pregnancy Delivered and iron deficeincy anemia                                              Post partum procedures:manual extraction of placenta Augmentation: N/A Complications: Possible chorioamnionitis  Hospital course: Onset of Labor With Vaginal Delivery      34y.o. yo [id number] 23w0das admitted for vaginal bleeding and advanced dilation, suspicious for inevitable abortion in second trimester. on 03/29/2020. Patient had an uncomplicated labor course as follows:  Membrane Rupture Time/Date: 11:01 PM ,03/30/2020   Delivery Method:Vaginal, Spontaneous  Episiotomy: None  Lacerations:  None  Patient had an uncomplicated postpartum course.  She is ambulating, tolerating a regular diet, passing flatus, and urinating well. Patient is discharged home in stable condition on 04/01/20.  Newborn Data: Birth date:03/30/2020  Birth time:11:01 PM  Gender:Female  Living status:Fetal Demise  Apgars: ,  Weight:   Magnesium Sulfate received: No BMZ received: No Rhophylac:N/A MMR:N/A T-DaP:has not reached 28 wks Transfusion:No  Physical exam  Vitals:   04/01/20 0431 04/01/20 0513 04/01/20 0755 04/01/20 1140  BP: (!) 81/55 103/60 (!) 96/53 (!) 98/53  Pulse: 95 87 79 84  Resp: '16  16 16  ' Temp: 98.5 F (36.9 C)  98.2 F (36.8 C) 98.4 F (36.9 C)  TempSrc: Oral  Oral Oral  SpO2: 100%  100% 100%   Weight:      Height:       General: alert, cooperative and no distress Lochia: appropriate Uterine Fundus: firm Incision: N/A DVT Evaluation: No evidence of DVT seen on physical exam. No cords or calf tenderness. No significant calf/ankle edema. Labs: Lab Results  Component Value Date   WBC 11.2 (H) 04/01/2020   HGB 7.9 (L) 04/01/2020   HCT 25.2 (L) 04/01/2020   MCV 70.6 (L) 04/01/2020   PLT 197 04/01/2020   CMP Latest Ref Rng & Units 09/02/2018  Glucose 70 - 99 mg/dL 76  BUN 6 - 20 mg/dL 7  Creatinine 0.44 - 1.00 mg/dL 0.62  Sodium 135 - 145 mmol/L 139  Potassium 3.5 - 5.1 mmol/L 3.4(L)  Chloride 98 - 111 mmol/L 106  CO2 22 - 32 mmol/L 20(L)  Calcium 8.9 - 10.3 mg/dL 9.2  Total Protein 6.5 - 8.1 g/dL 6.4(L)  Total Bilirubin 0.3 - 1.2 mg/dL 0.6  Alkaline Phos 38 - 126 U/L 94  AST 15 - 41 U/L 20  ALT 0 - 44 U/L 17   Edinburgh Score: Edinburgh Postnatal Depression Scale Screening Tool 09/02/2018  I have been able to laugh and see the funny side of things. 0  I have looked forward with enjoyment to things. 0  I have blamed myself unnecessarily when things went wrong. 0  I have been anxious or worried for no good reason. 0  I have  felt scared or panicky for no good reason. 0  Things have been getting on top of me. 0  I have been so unhappy that I have had difficulty sleeping. 0  I have felt sad or miserable. 0  I have been so unhappy that I have been crying. 0  The thought of harming myself has occurred to me. 0  Edinburgh Postnatal Depression Scale Total 0      After visit meds:  Allergies as of 04/01/2020   No Known Allergies     Medication List    STOP taking these medications   acetaminophen 325 MG tablet Commonly known as: Tylenol   benzocaine-Menthol 20-0.5 % Aero Commonly known as: DERMOPLAST   calcium carbonate 500 MG chewable tablet Commonly known as: TUMS - dosed in mg elemental calcium     TAKE these medications   ibuprofen 600 MG  tablet Commonly known as: ADVIL Take 1 tablet (600 mg total) by mouth every 6 (six) hours.   iron polysaccharides 150 MG capsule Commonly known as: NIFEREX Take 1 capsule (150 mg total) by mouth daily.   magnesium oxide 400 (241.3 Mg) MG tablet Commonly known as: MAG-OX Take 1 tablet (400 mg total) by mouth daily. Start taking on: April 02, 2020   prenatal multivitamin Tabs tablet Take 1 tablet by mouth daily at 12 noon.        Discharge home in stable condition Infant [place] Discharge instruction: per After Visit Summary and Postpartum booklet. Activity: Advance as tolerated. Pelvic rest for 6 weeks.  Diet: low salt diet Anticipated Birth Control: Unsure Postpartum Appointment:6 weeks Additional Postpartum F/U: Postpartum Depression checkup 20 wks Future Appointments:No future appointments. Follow up Visit:  [facility]. Schedule an appointment as soon as possible for a visit in 6 week(s).   Specialty: Obstetrics and Gynecology Why: Call office to schedule a 2 week mood evaluation appointment and then come to office for 6 week postpartum visit.  Contact information: [address] [place] [address] [phone]                  04/01/2020 Arrie Eastern, CNM

## 2020-04-03 LAB — SURGICAL PATHOLOGY

## 2020-05-02 DIAGNOSIS — F4321 Adjustment disorder with depressed mood: Secondary | ICD-10-CM | POA: Diagnosis not present

## 2020-05-02 DIAGNOSIS — Z8759 Personal history of other complications of pregnancy, childbirth and the puerperium: Secondary | ICD-10-CM | POA: Diagnosis not present

## 2020-05-02 DIAGNOSIS — Z3040 Encounter for surveillance of contraceptives, unspecified: Secondary | ICD-10-CM | POA: Diagnosis not present

## 2020-06-01 DIAGNOSIS — F3489 Other specified persistent mood disorders: Secondary | ICD-10-CM | POA: Diagnosis not present

## 2020-06-08 DIAGNOSIS — F3489 Other specified persistent mood disorders: Secondary | ICD-10-CM | POA: Diagnosis not present

## 2020-06-15 DIAGNOSIS — F3489 Other specified persistent mood disorders: Secondary | ICD-10-CM | POA: Diagnosis not present

## 2020-06-20 DIAGNOSIS — F439 Reaction to severe stress, unspecified: Secondary | ICD-10-CM | POA: Diagnosis not present

## 2020-07-06 DIAGNOSIS — F439 Reaction to severe stress, unspecified: Secondary | ICD-10-CM | POA: Diagnosis not present

## 2020-09-26 DIAGNOSIS — H5213 Myopia, bilateral: Secondary | ICD-10-CM | POA: Diagnosis not present

## 2020-10-19 DIAGNOSIS — F439 Reaction to severe stress, unspecified: Secondary | ICD-10-CM | POA: Diagnosis not present

## 2020-10-27 DIAGNOSIS — F439 Reaction to severe stress, unspecified: Secondary | ICD-10-CM | POA: Diagnosis not present

## 2020-11-07 DIAGNOSIS — F439 Reaction to severe stress, unspecified: Secondary | ICD-10-CM | POA: Diagnosis not present

## 2020-12-05 DIAGNOSIS — F439 Reaction to severe stress, unspecified: Secondary | ICD-10-CM | POA: Diagnosis not present

## 2020-12-15 DIAGNOSIS — F439 Reaction to severe stress, unspecified: Secondary | ICD-10-CM | POA: Diagnosis not present

## 2021-01-18 DIAGNOSIS — F439 Reaction to severe stress, unspecified: Secondary | ICD-10-CM | POA: Diagnosis not present

## 2021-02-01 DIAGNOSIS — F439 Reaction to severe stress, unspecified: Secondary | ICD-10-CM | POA: Diagnosis not present

## 2021-02-15 DIAGNOSIS — F439 Reaction to severe stress, unspecified: Secondary | ICD-10-CM | POA: Diagnosis not present

## 2021-03-19 DIAGNOSIS — N925 Other specified irregular menstruation: Secondary | ICD-10-CM | POA: Diagnosis not present

## 2021-03-19 DIAGNOSIS — N760 Acute vaginitis: Secondary | ICD-10-CM | POA: Diagnosis not present

## 2021-03-19 DIAGNOSIS — O3431 Maternal care for cervical incompetence, first trimester: Secondary | ICD-10-CM | POA: Diagnosis not present

## 2021-03-19 DIAGNOSIS — Z862 Personal history of diseases of the blood and blood-forming organs and certain disorders involving the immune mechanism: Secondary | ICD-10-CM | POA: Diagnosis not present

## 2021-03-19 DIAGNOSIS — Z113 Encounter for screening for infections with a predominantly sexual mode of transmission: Secondary | ICD-10-CM | POA: Diagnosis not present

## 2021-03-19 DIAGNOSIS — O3680X9 Pregnancy with inconclusive fetal viability, other fetus: Secondary | ICD-10-CM | POA: Diagnosis not present

## 2021-03-19 DIAGNOSIS — Z8759 Personal history of other complications of pregnancy, childbirth and the puerperium: Secondary | ICD-10-CM | POA: Diagnosis not present

## 2021-03-19 DIAGNOSIS — Z3A10 10 weeks gestation of pregnancy: Secondary | ICD-10-CM | POA: Diagnosis not present

## 2021-03-19 DIAGNOSIS — Z9889 Other specified postprocedural states: Secondary | ICD-10-CM | POA: Diagnosis not present

## 2021-03-19 DIAGNOSIS — Z3490 Encounter for supervision of normal pregnancy, unspecified, unspecified trimester: Secondary | ICD-10-CM | POA: Diagnosis not present

## 2021-03-19 LAB — HEPATITIS C ANTIBODY: HCV Ab: NEGATIVE

## 2021-03-19 LAB — OB RESULTS CONSOLE HEPATITIS B SURFACE ANTIGEN: Hepatitis B Surface Ag: NEGATIVE

## 2021-03-19 LAB — OB RESULTS CONSOLE HIV ANTIBODY (ROUTINE TESTING): HIV: NONREACTIVE

## 2021-03-19 LAB — OB RESULTS CONSOLE RUBELLA ANTIBODY, IGM: Rubella: IMMUNE

## 2021-03-19 LAB — OB RESULTS CONSOLE ANTIBODY SCREEN: Antibody Screen: NEGATIVE

## 2021-03-19 LAB — OB RESULTS CONSOLE ABO/RH: RH Type: POSITIVE

## 2021-03-19 LAB — HIV ANTIBODY (ROUTINE TESTING W REFLEX): HIV Screen 4th Generation wRfx: NEGATIVE

## 2021-03-19 LAB — OB RESULTS CONSOLE RPR: RPR: NONREACTIVE

## 2021-03-21 ENCOUNTER — Other Ambulatory Visit: Payer: Self-pay | Admitting: Obstetrics and Gynecology

## 2021-03-21 DIAGNOSIS — Z363 Encounter for antenatal screening for malformations: Secondary | ICD-10-CM

## 2021-03-28 ENCOUNTER — Encounter: Payer: Self-pay | Admitting: *Deleted

## 2021-04-02 ENCOUNTER — Encounter: Payer: Self-pay | Admitting: *Deleted

## 2021-04-02 ENCOUNTER — Ambulatory Visit: Payer: Medicaid Other | Admitting: *Deleted

## 2021-04-02 ENCOUNTER — Ambulatory Visit: Payer: Medicaid Other | Attending: Obstetrics and Gynecology

## 2021-04-02 ENCOUNTER — Other Ambulatory Visit: Payer: Self-pay | Admitting: *Deleted

## 2021-04-02 ENCOUNTER — Other Ambulatory Visit: Payer: Self-pay

## 2021-04-02 ENCOUNTER — Ambulatory Visit (HOSPITAL_BASED_OUTPATIENT_CLINIC_OR_DEPARTMENT_OTHER): Payer: Medicaid Other | Admitting: Obstetrics

## 2021-04-02 VITALS — BP 115/67 | HR 97

## 2021-04-02 DIAGNOSIS — O09521 Supervision of elderly multigravida, first trimester: Secondary | ICD-10-CM

## 2021-04-02 DIAGNOSIS — O09291 Supervision of pregnancy with other poor reproductive or obstetric history, first trimester: Secondary | ICD-10-CM | POA: Diagnosis not present

## 2021-04-02 DIAGNOSIS — Z8759 Personal history of other complications of pregnancy, childbirth and the puerperium: Secondary | ICD-10-CM

## 2021-04-02 DIAGNOSIS — Z3689 Encounter for other specified antenatal screening: Secondary | ICD-10-CM | POA: Insufficient documentation

## 2021-04-02 DIAGNOSIS — Z3A12 12 weeks gestation of pregnancy: Secondary | ICD-10-CM

## 2021-04-02 DIAGNOSIS — Z363 Encounter for antenatal screening for malformations: Secondary | ICD-10-CM | POA: Insufficient documentation

## 2021-04-02 NOTE — Progress Notes (Signed)
MFM Note  Referring Provider: Nigel Bridgeman, CNM  Bridget Summers is a gravida 5 para 2 currently at 12 weeks and 5 days.  She was seen in consultation today due to a prior previable delivery at 20 weeks.  Her pregnancy history is as follows:  First pregnancy: First trimester miscarriage Second pregnancy: 13 years ago, full-term NSVD Third pregnancy: 2 years ago, full-term NSVD Fourth pregnancy: 1 year ago, previable delivery at 20 weeks.  The patient reports that she started to experience vaginal bleeding followed by contractions when she presented to the hospital and ultimately had a spontaneous previable birth.  She reports that she took a boric acid capsule for treatment of a yeast infection the day before she started to bleed.  She denies any significant past medical history and denies any prior surgical history.  The patient has declined all screening tests for fetal aneuploidy in her current pregnancy.  On today's exam, a viable singleton intrauterine pregnancy was noted with a crown-rump length that was consistent with 12 weeks and 5 days,  giving her an [facility] of Oct 10, 2021.  The following were discussed today:  Prior previable birth at 20 weeks  The cause of her prior previable birth at 20 weeks remains undetermined.  Given that she had two prior uncomplicated full-term deliveries prior to the previable delivery at 20 weeks and the fact that she started to experience frequent contractions prior to delivery, indicates that cervical incompetence is an unlikely cause.  Management options to help improve her pregnancy outcome including a cervical cerclage stitch, weekly 17-P (Makena) injections, and serial cervical length measurements were discussed today.  The patient was advised that as her history does not indicate cervical incompetence, a prophylactic cerclage is probably not indicated at this time.  She was advised that weekly 17-P (Makena) injections may be a treatment option if  her prior previable birth was due to spontaneous preterm labor.  She was advised that if she opts for the weekly Makena injections, she would need to get weekly shots from 16 weeks until 36 weeks.  She was also advised that there may be a possibility that the FDA may withdraw Makena from the market within the next few months.  After discussing all management options, the patient stated that she would be comfortable with being followed with serial cervical length measurements.    A transvaginal cervical length measurement was scheduled in our office at around 16 weeks.  We will then see her for a detailed fetal anatomy scan and another cervical length measurement at around 19 weeks.  Should progressive cervical shortening be noted during her transvaginal cervical length measurements, a cervical cerclage may be considered at that time.  Alternatively, treatment with daily vaginal progesterone for a sonographically detected shortened cervix may also be considered.  No treatments may be necessary if her cervical lengths remain within the normal range.  Advanced maternal age  The patient has declined all screening tests for fetal aneuploidy in her current pregnancy.  We will assess for sonographic markers associated with Down syndrome during her fetal anatomy scan.    The patient stated that all of her questions have been answered to her complete satisfaction.  She will return at around 16 weeks for a transvaginal cervical length measurement.  A total of 45 minutes was spent counseling and coordinating the care for this patient.  Greater than 50% of the time was spent in direct face-to-face contact.  Recommendations:  Serial cervical length measurement starting at  16 weeks  Consider a rescue cerclage or vaginal progesterone should progressive cervical shortening be noted later in her pregnancy  No treatment may be necessary if her cervical lengths remain within the normal range

## 2021-04-04 ENCOUNTER — Other Ambulatory Visit: Payer: Self-pay | Admitting: *Deleted

## 2021-04-04 DIAGNOSIS — Z8759 Personal history of other complications of pregnancy, childbirth and the puerperium: Secondary | ICD-10-CM

## 2021-04-10 LAB — OB RESULTS CONSOLE GC/CHLAMYDIA
Chlamydia: POSITIVE
Gonorrhea: NEGATIVE

## 2021-04-23 ENCOUNTER — Ambulatory Visit: Payer: Medicaid Other

## 2021-04-23 ENCOUNTER — Other Ambulatory Visit: Payer: Medicaid Other

## 2021-04-30 ENCOUNTER — Other Ambulatory Visit: Payer: Medicaid Other

## 2021-04-30 ENCOUNTER — Ambulatory Visit: Payer: Medicaid Other | Attending: Obstetrics

## 2021-04-30 DIAGNOSIS — F439 Reaction to severe stress, unspecified: Secondary | ICD-10-CM | POA: Diagnosis not present

## 2021-05-11 ENCOUNTER — Ambulatory Visit: Payer: Medicaid Other

## 2021-05-11 ENCOUNTER — Other Ambulatory Visit: Payer: Medicaid Other

## 2021-05-22 ENCOUNTER — Ambulatory Visit: Payer: Medicaid Other | Attending: Obstetrics | Admitting: *Deleted

## 2021-05-22 ENCOUNTER — Other Ambulatory Visit: Payer: Self-pay | Admitting: *Deleted

## 2021-05-22 ENCOUNTER — Ambulatory Visit (HOSPITAL_BASED_OUTPATIENT_CLINIC_OR_DEPARTMENT_OTHER): Payer: Medicaid Other

## 2021-05-22 ENCOUNTER — Other Ambulatory Visit: Payer: Self-pay

## 2021-05-22 ENCOUNTER — Encounter: Payer: Self-pay | Admitting: *Deleted

## 2021-05-22 VITALS — BP 129/74 | HR 118

## 2021-05-22 DIAGNOSIS — O09522 Supervision of elderly multigravida, second trimester: Secondary | ICD-10-CM | POA: Diagnosis not present

## 2021-05-22 DIAGNOSIS — Z8759 Personal history of other complications of pregnancy, childbirth and the puerperium: Secondary | ICD-10-CM

## 2021-05-22 DIAGNOSIS — O09292 Supervision of pregnancy with other poor reproductive or obstetric history, second trimester: Secondary | ICD-10-CM

## 2021-05-22 DIAGNOSIS — O99212 Obesity complicating pregnancy, second trimester: Secondary | ICD-10-CM

## 2021-05-22 DIAGNOSIS — Z3A19 19 weeks gestation of pregnancy: Secondary | ICD-10-CM | POA: Insufficient documentation

## 2021-05-22 DIAGNOSIS — E669 Obesity, unspecified: Secondary | ICD-10-CM | POA: Diagnosis not present

## 2021-05-22 DIAGNOSIS — O09529 Supervision of elderly multigravida, unspecified trimester: Secondary | ICD-10-CM

## 2021-05-22 DIAGNOSIS — O3430 Maternal care for cervical incompetence, unspecified trimester: Secondary | ICD-10-CM

## 2021-06-05 ENCOUNTER — Ambulatory Visit: Payer: Medicaid Other

## 2021-06-11 ENCOUNTER — Ambulatory Visit: Payer: Medicaid Other | Admitting: *Deleted

## 2021-06-11 ENCOUNTER — Encounter: Payer: Self-pay | Admitting: *Deleted

## 2021-06-11 ENCOUNTER — Ambulatory Visit: Payer: Medicaid Other | Attending: Obstetrics

## 2021-06-11 ENCOUNTER — Other Ambulatory Visit: Payer: Self-pay

## 2021-06-11 VITALS — BP 115/62 | HR 84

## 2021-06-11 DIAGNOSIS — O3430 Maternal care for cervical incompetence, unspecified trimester: Secondary | ICD-10-CM | POA: Diagnosis not present

## 2021-06-11 DIAGNOSIS — Z3A22 22 weeks gestation of pregnancy: Secondary | ICD-10-CM

## 2021-06-11 DIAGNOSIS — O3432 Maternal care for cervical incompetence, second trimester: Secondary | ICD-10-CM | POA: Diagnosis not present

## 2021-06-11 DIAGNOSIS — O09522 Supervision of elderly multigravida, second trimester: Secondary | ICD-10-CM | POA: Insufficient documentation

## 2021-06-11 DIAGNOSIS — O09529 Supervision of elderly multigravida, unspecified trimester: Secondary | ICD-10-CM | POA: Diagnosis not present

## 2021-06-13 DIAGNOSIS — F439 Reaction to severe stress, unspecified: Secondary | ICD-10-CM | POA: Diagnosis not present

## 2021-06-18 ENCOUNTER — Ambulatory Visit: Payer: Medicaid Other

## 2021-06-26 ENCOUNTER — Ambulatory Visit: Payer: Medicaid Other

## 2021-07-09 ENCOUNTER — Ambulatory Visit: Payer: Medicaid Other | Attending: Obstetrics | Admitting: *Deleted

## 2021-07-09 ENCOUNTER — Other Ambulatory Visit: Payer: Self-pay | Admitting: *Deleted

## 2021-07-09 ENCOUNTER — Ambulatory Visit (HOSPITAL_BASED_OUTPATIENT_CLINIC_OR_DEPARTMENT_OTHER): Payer: Medicaid Other

## 2021-07-09 ENCOUNTER — Encounter: Payer: Self-pay | Admitting: *Deleted

## 2021-07-09 ENCOUNTER — Other Ambulatory Visit: Payer: Self-pay

## 2021-07-09 VITALS — BP 103/62 | HR 85

## 2021-07-09 DIAGNOSIS — O99212 Obesity complicating pregnancy, second trimester: Secondary | ICD-10-CM

## 2021-07-09 DIAGNOSIS — O09522 Supervision of elderly multigravida, second trimester: Secondary | ICD-10-CM

## 2021-07-09 DIAGNOSIS — O3432 Maternal care for cervical incompetence, second trimester: Secondary | ICD-10-CM

## 2021-07-09 DIAGNOSIS — Z3A26 26 weeks gestation of pregnancy: Secondary | ICD-10-CM | POA: Insufficient documentation

## 2021-07-09 DIAGNOSIS — O09292 Supervision of pregnancy with other poor reproductive or obstetric history, second trimester: Secondary | ICD-10-CM | POA: Diagnosis not present

## 2021-07-09 DIAGNOSIS — O09529 Supervision of elderly multigravida, unspecified trimester: Secondary | ICD-10-CM

## 2021-07-09 DIAGNOSIS — O3430 Maternal care for cervical incompetence, unspecified trimester: Secondary | ICD-10-CM

## 2021-07-23 DIAGNOSIS — Z3A28 28 weeks gestation of pregnancy: Secondary | ICD-10-CM | POA: Diagnosis not present

## 2021-07-23 DIAGNOSIS — Z331 Pregnant state, incidental: Secondary | ICD-10-CM | POA: Diagnosis not present

## 2021-07-23 DIAGNOSIS — Z113 Encounter for screening for infections with a predominantly sexual mode of transmission: Secondary | ICD-10-CM | POA: Diagnosis not present

## 2021-07-23 DIAGNOSIS — Z3493 Encounter for supervision of normal pregnancy, unspecified, third trimester: Secondary | ICD-10-CM | POA: Diagnosis not present

## 2021-08-07 ENCOUNTER — Ambulatory Visit: Payer: Medicaid Other

## 2021-08-17 ENCOUNTER — Ambulatory Visit: Payer: Medicaid Other

## 2021-08-20 ENCOUNTER — Ambulatory Visit: Payer: Medicaid Other

## 2021-09-03 ENCOUNTER — Ambulatory Visit: Payer: Medicaid Other | Attending: Obstetrics and Gynecology | Admitting: *Deleted

## 2021-09-03 ENCOUNTER — Ambulatory Visit (HOSPITAL_BASED_OUTPATIENT_CLINIC_OR_DEPARTMENT_OTHER): Payer: Medicaid Other

## 2021-09-03 VITALS — BP 119/70 | HR 99

## 2021-09-03 DIAGNOSIS — O09293 Supervision of pregnancy with other poor reproductive or obstetric history, third trimester: Secondary | ICD-10-CM | POA: Insufficient documentation

## 2021-09-03 DIAGNOSIS — Z3A34 34 weeks gestation of pregnancy: Secondary | ICD-10-CM | POA: Insufficient documentation

## 2021-09-03 DIAGNOSIS — O99213 Obesity complicating pregnancy, third trimester: Secondary | ICD-10-CM | POA: Diagnosis not present

## 2021-09-03 DIAGNOSIS — O3433 Maternal care for cervical incompetence, third trimester: Secondary | ICD-10-CM | POA: Insufficient documentation

## 2021-09-03 DIAGNOSIS — O09522 Supervision of elderly multigravida, second trimester: Secondary | ICD-10-CM

## 2021-09-03 DIAGNOSIS — O09523 Supervision of elderly multigravida, third trimester: Secondary | ICD-10-CM

## 2021-09-03 DIAGNOSIS — O3432 Maternal care for cervical incompetence, second trimester: Secondary | ICD-10-CM

## 2021-09-03 DIAGNOSIS — O99212 Obesity complicating pregnancy, second trimester: Secondary | ICD-10-CM

## 2021-09-04 ENCOUNTER — Other Ambulatory Visit: Payer: Self-pay | Admitting: *Deleted

## 2021-09-05 DIAGNOSIS — K029 Dental caries, unspecified: Secondary | ICD-10-CM | POA: Diagnosis not present

## 2021-09-13 DIAGNOSIS — Z20828 Contact with and (suspected) exposure to other viral communicable diseases: Secondary | ICD-10-CM | POA: Diagnosis not present

## 2021-09-13 DIAGNOSIS — Z3483 Encounter for supervision of other normal pregnancy, third trimester: Secondary | ICD-10-CM | POA: Diagnosis not present

## 2021-09-21 DIAGNOSIS — O368199 Decreased fetal movements, unspecified trimester, other fetus: Secondary | ICD-10-CM | POA: Diagnosis not present

## 2021-09-21 DIAGNOSIS — O99213 Obesity complicating pregnancy, third trimester: Secondary | ICD-10-CM | POA: Diagnosis not present

## 2021-09-21 DIAGNOSIS — N760 Acute vaginitis: Secondary | ICD-10-CM | POA: Diagnosis not present

## 2021-09-21 DIAGNOSIS — Z3A37 37 weeks gestation of pregnancy: Secondary | ICD-10-CM | POA: Diagnosis not present

## 2021-09-27 ENCOUNTER — Other Ambulatory Visit: Payer: Self-pay | Admitting: Obstetrics & Gynecology

## 2021-09-28 ENCOUNTER — Other Ambulatory Visit: Payer: Self-pay | Admitting: Obstetrics & Gynecology

## 2021-10-02 ENCOUNTER — Telehealth (HOSPITAL_COMMUNITY): Payer: Self-pay | Admitting: *Deleted

## 2021-10-02 ENCOUNTER — Encounter (HOSPITAL_COMMUNITY): Payer: Self-pay

## 2021-10-02 NOTE — Telephone Encounter (Signed)
Preadmission screen  

## 2021-10-03 ENCOUNTER — Telehealth (HOSPITAL_COMMUNITY): Payer: Self-pay | Admitting: *Deleted

## 2021-10-03 NOTE — Telephone Encounter (Signed)
Preadmission screen  

## 2021-10-04 ENCOUNTER — Telehealth (HOSPITAL_COMMUNITY): Payer: Self-pay | Admitting: *Deleted

## 2021-10-04 NOTE — Telephone Encounter (Signed)
Preadmission screen  

## 2021-10-05 ENCOUNTER — Telehealth (HOSPITAL_COMMUNITY): Payer: Self-pay | Admitting: *Deleted

## 2021-10-05 ENCOUNTER — Encounter (HOSPITAL_COMMUNITY): Payer: Self-pay | Admitting: *Deleted

## 2021-10-05 NOTE — Telephone Encounter (Signed)
Preadmission screen  

## 2021-10-08 ENCOUNTER — Other Ambulatory Visit: Payer: Self-pay

## 2021-10-08 ENCOUNTER — Inpatient Hospital Stay (HOSPITAL_COMMUNITY): Payer: Medicaid Other | Admitting: Anesthesiology

## 2021-10-08 ENCOUNTER — Encounter (HOSPITAL_COMMUNITY): Payer: Self-pay | Admitting: Obstetrics & Gynecology

## 2021-10-08 ENCOUNTER — Encounter (HOSPITAL_COMMUNITY)
Admission: AD | Disposition: A | Discharge status: Home / Self Care | Payer: Self-pay | Source: Home / Self Care | Attending: Obstetrics & Gynecology

## 2021-10-08 ENCOUNTER — Inpatient Hospital Stay (HOSPITAL_COMMUNITY)
Admission: AD | Admit: 2021-10-08 | Discharge: 2021-10-11 | DRG: 786 | Disposition: A | Discharge status: Home / Self Care | Payer: Medicaid Other | Attending: Obstetrics & Gynecology | Admitting: Obstetrics & Gynecology

## 2021-10-08 DIAGNOSIS — O3663X0 Maternal care for excessive fetal growth, third trimester, not applicable or unspecified: Secondary | ICD-10-CM | POA: Diagnosis not present

## 2021-10-08 DIAGNOSIS — Z91148 Patient's other noncompliance with medication regimen for other reason: Secondary | ICD-10-CM

## 2021-10-08 DIAGNOSIS — Z3A39 39 weeks gestation of pregnancy: Secondary | ICD-10-CM

## 2021-10-08 DIAGNOSIS — O134 Gestational [pregnancy-induced] hypertension without significant proteinuria, complicating childbirth: Secondary | ICD-10-CM | POA: Diagnosis not present

## 2021-10-08 DIAGNOSIS — D509 Iron deficiency anemia, unspecified: Secondary | ICD-10-CM | POA: Diagnosis present

## 2021-10-08 DIAGNOSIS — O481 Prolonged pregnancy: Secondary | ICD-10-CM | POA: Diagnosis not present

## 2021-10-08 DIAGNOSIS — D649 Anemia, unspecified: Secondary | ICD-10-CM | POA: Diagnosis not present

## 2021-10-08 DIAGNOSIS — O358XX0 Maternal care for other (suspected) fetal abnormality and damage, not applicable or unspecified: Secondary | ICD-10-CM | POA: Diagnosis present

## 2021-10-08 DIAGNOSIS — O3433 Maternal care for cervical incompetence, third trimester: Secondary | ICD-10-CM | POA: Diagnosis not present

## 2021-10-08 DIAGNOSIS — O139 Gestational [pregnancy-induced] hypertension without significant proteinuria, unspecified trimester: Secondary | ICD-10-CM

## 2021-10-08 DIAGNOSIS — O9902 Anemia complicating childbirth: Secondary | ICD-10-CM | POA: Diagnosis present

## 2021-10-08 DIAGNOSIS — O133 Gestational [pregnancy-induced] hypertension without significant proteinuria, third trimester: Secondary | ICD-10-CM | POA: Diagnosis present

## 2021-10-08 DIAGNOSIS — O99214 Obesity complicating childbirth: Secondary | ICD-10-CM | POA: Diagnosis not present

## 2021-10-08 HISTORY — DX: Gestational (pregnancy-induced) hypertension without significant proteinuria, unspecified trimester: O13.9

## 2021-10-08 LAB — COMPREHENSIVE METABOLIC PANEL
ALT: 11 U/L (ref 0–44)
AST: 16 U/L (ref 15–41)
Albumin: 2.8 g/dL — ABNORMAL LOW (ref 3.5–5.0)
Alkaline Phosphatase: 104 U/L (ref 38–126)
Anion gap: 7 (ref 5–15)
BUN: 6 mg/dL (ref 6–20)
CO2: 22 mmol/L (ref 22–32)
Calcium: 9.3 mg/dL (ref 8.9–10.3)
Chloride: 110 mmol/L (ref 98–111)
Creatinine, Ser: 0.67 mg/dL (ref 0.44–1.00)
GFR, Estimated: >60 mL/min (ref >60)
Glucose, Bld: 95 mg/dL (ref 70–99)
Potassium: 3.1 mmol/L — ABNORMAL LOW (ref 3.5–5.1)
Sodium: 139 mmol/L (ref 135–145)
Total Bilirubin: 0.7 mg/dL (ref 0.3–1.2)
Total Protein: 6.6 g/dL (ref 6.5–8.1)

## 2021-10-08 LAB — PROTEIN / CREATININE RATIO, URINE
Creatinine, Urine: 300.02 mg/dL
Protein Creatinine Ratio: 0.13 mg/mg{Cre} (ref 0.00–0.15)
Total Protein, Urine: 38 mg/dL

## 2021-10-08 LAB — CBC
HCT: 29.7 % — ABNORMAL LOW (ref 36.0–46.0)
HCT: 27.2 % — ABNORMAL LOW (ref 36.0–46.0)
Hemoglobin: 8.9 g/dL — ABNORMAL LOW (ref 12.0–15.0)
Hemoglobin: 8.1 g/dL — ABNORMAL LOW (ref 12.0–15.0)
MCH: 20.8 pg — ABNORMAL LOW (ref 26.0–34.0)
MCH: 20.5 pg — ABNORMAL LOW (ref 26.0–34.0)
MCHC: 30 g/dL (ref 30.0–36.0)
MCHC: 29.8 g/dL — ABNORMAL LOW (ref 30.0–36.0)
MCV: 69.4 fL — ABNORMAL LOW (ref 80.0–100.0)
MCV: 68.9 fL — ABNORMAL LOW (ref 80.0–100.0)
Platelets: 205 10*3/uL (ref 150–400)
Platelets: 189 10*3/uL (ref 150–400)
RBC: 4.28 MIL/uL (ref 3.87–5.11)
RBC: 3.95 MIL/uL (ref 3.87–5.11)
RDW: 16.3 % — ABNORMAL HIGH (ref 11.5–15.5)
RDW: 16.2 % — ABNORMAL HIGH (ref 11.5–15.5)
WBC: 12.3 10*3/uL — ABNORMAL HIGH (ref 4.0–10.5)
WBC: 12 10*3/uL — ABNORMAL HIGH (ref 4.0–10.5)
nRBC: 0 % (ref 0.0–0.2)
nRBC: 0 % (ref 0.0–0.2)

## 2021-10-08 LAB — TYPE AND SCREEN
ABO/RH(D): A POS
Antibody Screen: NEGATIVE

## 2021-10-08 LAB — RPR: RPR Ser Ql: NONREACTIVE

## 2021-10-08 SURGERY — Surgical Case
Anesthesia: Spinal

## 2021-10-08 MED ORDER — OXYCODONE HCL 5 MG PO TABS
5.0000 mg | ORAL_TABLET | ORAL | Status: DC | PRN
  Administered 2021-10-09 – 2021-10-10 (×2): 5 mg via ORAL
  Filled 2021-10-08 (×2): qty 1

## 2021-10-08 MED ORDER — HYDRALAZINE HCL 20 MG/ML IJ SOLN: 10.0000 mg | INTRAMUSCULAR | Status: DC | PRN

## 2021-10-08 MED ORDER — DIPHENHYDRAMINE HCL 25 MG PO CAPS
25.0000 mg | ORAL_CAPSULE | Freq: Four times a day (QID) | ORAL | Status: DC | PRN
  Administered 2021-10-08 – 2021-10-09 (×3): 25 mg via ORAL
  Filled 2021-10-08 (×3): qty 1

## 2021-10-08 MED ORDER — PRENATAL MULTIVITAMIN CH
1.0000 | ORAL_TABLET | Freq: Every day | ORAL | Status: DC
  Administered 2021-10-09 – 2021-10-11 (×3): 1 via ORAL
  Filled 2021-10-08 (×4): qty 1

## 2021-10-08 MED ORDER — LACTATED RINGERS IV SOLN: INTRAVENOUS | Status: DC

## 2021-10-08 MED ORDER — EPHEDRINE 5 MG/ML INJ: 10.0000 mg | INTRAVENOUS | Status: DC | PRN

## 2021-10-08 MED ORDER — COCONUT OIL OIL: 1.0000 "application " | TOPICAL_OIL | Status: DC | PRN

## 2021-10-08 MED ORDER — SODIUM CHLORIDE 0.9 % IR SOLN
Status: DC | PRN
  Administered 2021-10-08: 1

## 2021-10-08 MED ORDER — SIMETHICONE 80 MG PO CHEW: 80.0000 mg | CHEWABLE_TABLET | ORAL | Status: DC | PRN

## 2021-10-08 MED ORDER — FENTANYL CITRATE (PF) 100 MCG/2ML IJ SOLN: 50.0000 ug | INTRAMUSCULAR | Status: DC | PRN

## 2021-10-08 MED ORDER — ACETAMINOPHEN 500 MG PO TABS
1000.0000 mg | ORAL_TABLET | Freq: Four times a day (QID) | ORAL | Status: DC
  Administered 2021-10-09 – 2021-10-10 (×6): 1000 mg via ORAL
  Filled 2021-10-08 (×9): qty 2

## 2021-10-08 MED ORDER — OXYCODONE-ACETAMINOPHEN 5-325 MG PO TABS: 2.0000 | ORAL_TABLET | ORAL | Status: DC | PRN

## 2021-10-08 MED ORDER — LACTATED RINGERS IV SOLN: 500.0000 mL | Freq: Once | INTRAVENOUS | Status: DC

## 2021-10-08 MED ORDER — ZOLPIDEM TARTRATE 5 MG PO TABS: 5.0000 mg | ORAL_TABLET | Freq: Every evening | ORAL | Status: DC | PRN

## 2021-10-08 MED ORDER — FENTANYL-BUPIVACAINE-NACL 0.5-0.125-0.9 MG/250ML-% EP SOLN: 12.0000 mL/h | EPIDURAL | Status: DC | PRN

## 2021-10-08 MED ORDER — MORPHINE SULFATE (PF) 0.5 MG/ML IJ SOLN
INTRAMUSCULAR | Status: DC | PRN
  Administered 2021-10-08: .15 mg via INTRATHECAL

## 2021-10-08 MED ORDER — OXYTOCIN BOLUS FROM INFUSION: 333.0000 mL | Freq: Once | INTRAVENOUS | Status: DC

## 2021-10-08 MED ORDER — FERROUS SULFATE 325 (65 FE) MG PO TABS
325.0000 mg | ORAL_TABLET | Freq: Two times a day (BID) | ORAL | Status: DC
  Administered 2021-10-09: 325 mg via ORAL
  Filled 2021-10-08: qty 1

## 2021-10-08 MED ORDER — MEPERIDINE HCL 25 MG/ML IJ SOLN: 6.2500 mg | INTRAMUSCULAR | Status: DC | PRN

## 2021-10-08 MED ORDER — FENTANYL CITRATE (PF) 100 MCG/2ML IJ SOLN
INTRAMUSCULAR | Status: DC | PRN
  Administered 2021-10-08: 15 ug via INTRATHECAL

## 2021-10-08 MED ORDER — ONDANSETRON HCL 4 MG/2ML IJ SOLN: 4.0000 mg | Freq: Once | INTRAMUSCULAR | Status: DC | PRN

## 2021-10-08 MED ORDER — LACTATED RINGERS IV SOLN: 500.0000 mL | INTRAVENOUS | Status: DC | PRN

## 2021-10-08 MED ORDER — BUPIVACAINE IN DEXTROSE 0.75-8.25 % IT SOLN
INTRATHECAL | Status: DC | PRN
  Administered 2021-10-08: 1.6 mL via INTRATHECAL

## 2021-10-08 MED ORDER — SOD CITRATE-CITRIC ACID 500-334 MG/5ML PO SOLN
30.0000 mL | ORAL | Status: DC | PRN
  Filled 2021-10-08: qty 30

## 2021-10-08 MED ORDER — SOD CITRATE-CITRIC ACID 500-334 MG/5ML PO SOLN: 30.0000 mL | ORAL | Status: DC | PRN

## 2021-10-08 MED ORDER — OXYTOCIN-SODIUM CHLORIDE 30-0.9 UT/500ML-% IV SOLN
INTRAVENOUS | Status: AC
  Filled 2021-10-08: qty 500

## 2021-10-08 MED ORDER — LIDOCAINE HCL (PF) 1 % IJ SOLN: 30.0000 mL | INTRAMUSCULAR | Status: DC | PRN

## 2021-10-08 MED ORDER — SIMETHICONE 80 MG PO CHEW
80.0000 mg | CHEWABLE_TABLET | Freq: Three times a day (TID) | ORAL | Status: DC
  Administered 2021-10-08 – 2021-10-11 (×8): 80 mg via ORAL
  Filled 2021-10-08 (×8): qty 1

## 2021-10-08 MED ORDER — HYDROMORPHONE HCL 1 MG/ML IJ SOLN
0.2500 mg | INTRAMUSCULAR | Status: DC | PRN
  Administered 2021-10-08: 0.5 mg via INTRAVENOUS

## 2021-10-08 MED ORDER — OXYTOCIN-SODIUM CHLORIDE 30-0.9 UT/500ML-% IV SOLN: 2.5000 [IU]/h | INTRAVENOUS | Status: DC

## 2021-10-08 MED ORDER — ACETAMINOPHEN 325 MG PO TABS: 650.0000 mg | ORAL_TABLET | ORAL | Status: DC | PRN

## 2021-10-08 MED ORDER — LABETALOL HCL 5 MG/ML IV SOLN: 40.0000 mg | INTRAVENOUS | Status: DC | PRN

## 2021-10-08 MED ORDER — BUPIVACAINE HCL 0.5 % IJ SOLN
INTRAMUSCULAR | Status: DC | PRN
  Administered 2021-10-08: 50 mL

## 2021-10-08 MED ORDER — DEXAMETHASONE SODIUM PHOSPHATE 4 MG/ML IJ SOLN
INTRAMUSCULAR | Status: AC
  Filled 2021-10-08: qty 1

## 2021-10-08 MED ORDER — SENNOSIDES-DOCUSATE SODIUM 8.6-50 MG PO TABS
2.0000 | ORAL_TABLET | ORAL | Status: DC
  Administered 2021-10-10 – 2021-10-11 (×2): 2 via ORAL
  Filled 2021-10-08 (×4): qty 2

## 2021-10-08 MED ORDER — PHENYLEPHRINE HCL-NACL 20-0.9 MG/250ML-% IV SOLN
INTRAVENOUS | Status: DC | PRN
  Administered 2021-10-08: 60 ug/min via INTRAVENOUS

## 2021-10-08 MED ORDER — MENTHOL 3 MG MT LOZG: 1.0000 | LOZENGE | OROMUCOSAL | Status: DC | PRN

## 2021-10-08 MED ORDER — PHENYLEPHRINE 80 MCG/ML (10ML) SYRINGE FOR IV PUSH (FOR BLOOD PRESSURE SUPPORT): 80.0000 ug | PREFILLED_SYRINGE | INTRAVENOUS | Status: DC | PRN

## 2021-10-08 MED ORDER — GABAPENTIN 100 MG PO CAPS: 100.0000 mg | ORAL_CAPSULE | Freq: Three times a day (TID) | ORAL | Status: DC | PRN

## 2021-10-08 MED ORDER — LABETALOL HCL 5 MG/ML IV SOLN: 20.0000 mg | INTRAVENOUS | Status: DC | PRN

## 2021-10-08 MED ORDER — DIPHENHYDRAMINE HCL 50 MG/ML IJ SOLN: 12.5000 mg | INTRAMUSCULAR | Status: DC | PRN

## 2021-10-08 MED ORDER — KETOROLAC TROMETHAMINE 30 MG/ML IJ SOLN
30.0000 mg | Freq: Four times a day (QID) | INTRAMUSCULAR | Status: AC
  Administered 2021-10-08 – 2021-10-09 (×3): 30 mg via INTRAVENOUS
  Filled 2021-10-08 (×4): qty 1

## 2021-10-08 MED ORDER — WITCH HAZEL-GLYCERIN EX PADS: 1.0000 "application " | MEDICATED_PAD | CUTANEOUS | Status: DC | PRN

## 2021-10-08 MED ORDER — OXYCODONE-ACETAMINOPHEN 5-325 MG PO TABS: 1.0000 | ORAL_TABLET | ORAL | Status: DC | PRN

## 2021-10-08 MED ORDER — IBUPROFEN 600 MG PO TABS
600.0000 mg | ORAL_TABLET | Freq: Four times a day (QID) | ORAL | Status: DC
  Administered 2021-10-09 – 2021-10-11 (×9): 600 mg via ORAL
  Filled 2021-10-08 (×9): qty 1

## 2021-10-08 MED ORDER — TETANUS-DIPHTH-ACELL PERTUSSIS 5-2.5-18.5 LF-MCG/0.5 IM SUSY: 0.5000 mL | PREFILLED_SYRINGE | Freq: Once | INTRAMUSCULAR | Status: DC

## 2021-10-08 MED ORDER — TERBUTALINE SULFATE 1 MG/ML IJ SOLN: 0.2500 mg | Freq: Once | INTRAMUSCULAR | Status: DC | PRN

## 2021-10-08 MED ORDER — PHENYLEPHRINE HCL (PRESSORS) 10 MG/ML IV SOLN
INTRAVENOUS | Status: DC | PRN
Start: 2021-10-08 — End: 2021-10-08
  Administered 2021-10-08: 80 ug via INTRAVENOUS

## 2021-10-08 MED ORDER — KETOROLAC TROMETHAMINE 30 MG/ML IJ SOLN: 30.0000 mg | Freq: Once | INTRAMUSCULAR | Status: AC | PRN

## 2021-10-08 MED ORDER — FLEET ENEMA 7-19 GM/118ML RE ENEM: 1.0000 | ENEMA | RECTAL | Status: DC | PRN

## 2021-10-08 MED ORDER — ONDANSETRON HCL 4 MG/2ML IJ SOLN
INTRAMUSCULAR | Status: AC
  Filled 2021-10-08: qty 2

## 2021-10-08 MED ORDER — ACETAMINOPHEN 10 MG/ML IV SOLN
INTRAVENOUS | Status: AC
  Filled 2021-10-08: qty 100

## 2021-10-08 MED ORDER — DEXAMETHASONE SODIUM PHOSPHATE 4 MG/ML IJ SOLN
INTRAMUSCULAR | Status: DC | PRN
  Administered 2021-10-08: 4 mg via INTRAVENOUS

## 2021-10-08 MED ORDER — CEFAZOLIN SODIUM-DEXTROSE 2-3 GM-%(50ML) IV SOLR
INTRAVENOUS | Status: DC | PRN
  Administered 2021-10-08: 2 g via INTRAVENOUS

## 2021-10-08 MED ORDER — BUPIVACAINE HCL (PF) 0.5 % IJ SOLN
INTRAMUSCULAR | Status: AC
  Filled 2021-10-08: qty 30

## 2021-10-08 MED ORDER — ONDANSETRON HCL 4 MG/2ML IJ SOLN: 4.0000 mg | Freq: Four times a day (QID) | INTRAMUSCULAR | Status: DC | PRN

## 2021-10-08 MED ORDER — HYDROMORPHONE HCL 1 MG/ML IJ SOLN
INTRAMUSCULAR | Status: AC
  Filled 2021-10-08: qty 0.5

## 2021-10-08 MED ORDER — HYDROMORPHONE HCL 1 MG/ML IJ SOLN: 0.2000 mg | INTRAMUSCULAR | Status: DC | PRN

## 2021-10-08 MED ORDER — ACETAMINOPHEN 10 MG/ML IV SOLN
INTRAVENOUS | Status: DC | PRN
  Administered 2021-10-08: 1000 mg via INTRAVENOUS

## 2021-10-08 MED ORDER — FENTANYL-BUPIVACAINE-NACL 0.5-0.125-0.9 MG/250ML-% EP SOLN
EPIDURAL | Status: AC
  Filled 2021-10-08: qty 250

## 2021-10-08 MED ORDER — LABETALOL HCL 5 MG/ML IV SOLN: 80.0000 mg | INTRAVENOUS | Status: DC | PRN

## 2021-10-08 MED ORDER — OXYTOCIN-SODIUM CHLORIDE 30-0.9 UT/500ML-% IV SOLN
2.5000 [IU]/h | INTRAVENOUS | Status: AC
  Administered 2021-10-08 (×2): 2.5 [IU]/h via INTRAVENOUS
  Filled 2021-10-08: qty 500

## 2021-10-08 MED ORDER — OXYTOCIN-SODIUM CHLORIDE 30-0.9 UT/500ML-% IV SOLN: 1.0000 m[IU]/min | INTRAVENOUS | Status: DC

## 2021-10-08 MED ORDER — MORPHINE SULFATE (PF) 0.5 MG/ML IJ SOLN
INTRAMUSCULAR | Status: AC
  Filled 2021-10-08: qty 10

## 2021-10-08 MED ORDER — OXYTOCIN-SODIUM CHLORIDE 30-0.9 UT/500ML-% IV SOLN
INTRAVENOUS | Status: DC | PRN
  Administered 2021-10-08: 300 mL via INTRAVENOUS

## 2021-10-08 MED ORDER — DIBUCAINE (PERIANAL) 1 % EX OINT
1.0000 | TOPICAL_OINTMENT | CUTANEOUS | Status: DC | PRN
Start: 2021-10-08 — End: 2021-10-11

## 2021-10-08 MED ORDER — FENTANYL CITRATE (PF) 100 MCG/2ML IJ SOLN
INTRAMUSCULAR | Status: AC
  Filled 2021-10-08: qty 2

## 2021-10-08 MED ORDER — ONDANSETRON HCL 4 MG/2ML IJ SOLN
INTRAMUSCULAR | Status: DC | PRN
Start: 2021-10-08 — End: 2021-10-08
  Administered 2021-10-08: 4 mg via INTRAVENOUS

## 2021-10-08 SURGICAL SUPPLY — 41 items
BENZOIN TINCTURE PRP APPL 2/3 (GAUZE/BANDAGES/DRESSINGS) ×1 IMPLANT
CHLORAPREP W/TINT 26ML (MISCELLANEOUS) ×4 IMPLANT
CLAMP CORD UMBIL (MISCELLANEOUS) ×2 IMPLANT
CLOTH BEACON ORANGE TIMEOUT ST (SAFETY) ×2 IMPLANT
DERMABOND ADVANCED (GAUZE/BANDAGES/DRESSINGS)
DERMABOND ADVANCED .7 DNX12 (GAUZE/BANDAGES/DRESSINGS) IMPLANT
DRAPE C SECTION CLR SCREEN (DRAPES) ×2 IMPLANT
DRSG OPSITE POSTOP 4X10 (GAUZE/BANDAGES/DRESSINGS) ×2 IMPLANT
ELECT REM PT RETURN 9FT ADLT (ELECTROSURGICAL) ×2
ELECTRODE REM PT RTRN 9FT ADLT (ELECTROSURGICAL) ×1 IMPLANT
EXTRACTOR VACUUM M CUP 4 TUBE (SUCTIONS) IMPLANT
GAUZE SPONGE 4X4 12PLY STRL LF (GAUZE/BANDAGES/DRESSINGS) ×2 IMPLANT
GLOVE BIOGEL PI IND STRL 7.0 (GLOVE) ×2 IMPLANT
GLOVE BIOGEL PI INDICATOR 7.0 (GLOVE) ×2
GLOVE SURG SS PI 6.5 STRL IVOR (GLOVE) ×2 IMPLANT
GOWN STRL REUS W/TWL LRG LVL3 (GOWN DISPOSABLE) ×4 IMPLANT
KIT ABG SYR 3ML LUER SLIP (SYRINGE) IMPLANT
NDL HYPO 25X5/8 SAFETYGLIDE (NEEDLE) IMPLANT
NEEDLE HYPO 25X5/8 SAFETYGLIDE (NEEDLE) IMPLANT
NS IRRIG 1000ML POUR BTL (IV SOLUTION) ×2 IMPLANT
PACK C SECTION WH (CUSTOM PROCEDURE TRAY) ×2 IMPLANT
PAD ABD DERMACEA PRESS 5X9 (GAUZE/BANDAGES/DRESSINGS) ×1 IMPLANT
PAD OB MATERNITY 4.3X12.25 (PERSONAL CARE ITEMS) ×2 IMPLANT
RTRCTR C-SECT PINK 25CM LRG (MISCELLANEOUS) ×2 IMPLANT
SUT CHROMIC 1 CTX 36 (SUTURE) IMPLANT
SUT CHROMIC 2 0 CT 1 (SUTURE) ×2 IMPLANT
SUT MNCRL 0 VIOLET CTX 36 (SUTURE) IMPLANT
SUT MON AB 4-0 PS1 27 (SUTURE) ×2 IMPLANT
SUT MONOCRYL 0 CTX 36 (SUTURE) ×8
SUT PLAIN 1 NONE 54 (SUTURE) IMPLANT
SUT PLAIN 2 0 (SUTURE)
SUT PLAIN 2 0 XLH (SUTURE) IMPLANT
SUT PLAIN ABS 2-0 CT1 27XMFL (SUTURE) IMPLANT
SUT VIC AB 0 CTX 36 (SUTURE) ×2
SUT VIC AB 0 CTX36XBRD ANBCTRL (SUTURE) ×1 IMPLANT
SUT VIC AB 1 CTX 36 (SUTURE) ×4
SUT VIC AB 1 CTX36XBRD ANBCTRL (SUTURE) ×2 IMPLANT
TAPE STRIPS DRAPE STRL (GAUZE/BANDAGES/DRESSINGS) ×1 IMPLANT
TOWEL OR 17X24 6PK STRL BLUE (TOWEL DISPOSABLE) ×2 IMPLANT
TRAY FOLEY W/BAG SLVR 14FR LF (SET/KITS/TRAYS/PACK) ×2 IMPLANT
WATER STERILE IRR 1000ML POUR (IV SOLUTION) ×2 IMPLANT

## 2021-10-08 NOTE — H&P (Signed)
OB ADMISSION HISTORY & PHYSICAL  Admission Date: 10/08/2021 12:04 AM  Admit Diagnosis: Gestational hypertension  Bridget Summers is a 36 y.o. [id number] at [redacted]w[redacted]d who presented to MAU today for complaint of regular painful contractions. Patient notes that the contractions started yesterday afternoon around 1600.  However, upon evaluation patient found to have elevated blood pressure.  Patient denies HA, visual disturbances, RUQ pain, or SOB.  Patient reports good fetal movement and some bloody show.  Patient states contractions are present.  She denies issues during the pregnancy.   History of current pregnancy: [id number]   Prenatal Care with: [facility] Patient entered prenatal care at 10 wks.   EDC 10/09/21 by 10 week 6 day ultrasound NOT consistent with LMP of 01/16/21.  Anatomy scan:  20 wks (done by MFM) complete w/ posterior placenta.   Recurrent ultrasounds performed for cervical length  Significant prenatal problems: History of gestational hypertension last pregnancy and now current Maternal obesity affecting pregnancy BMI 42 AMA - declined genetic testing LGA - history of macrosomia Iron deficiency anemia - non compliant w/ iron H/O chlamydia this pregnancy - TOC negative Cervical incompetence declined cerclage or progesterone Vitamin D deficiency Fetal polydactyly   Prenatal Labs: ABO, Rh: --/--/A POS (05/22 0345) Antibody: NEG (05/22 0345) Rubella: Immune (10/31 0000)  RPR: Nonreactive (10/31 0000)  HBsAg: Negative (10/31 0000)  HIV: Non-reactive, n (10/31 0000)  1 HR GCT: PASS GBS:  NEG GC/CHL: POS - treated Test of cure NEG Genetics: Declined Vaccines: Tdap: declined Flu declined Covid: declined  Prenatal Transfer Tool  Maternal Diabetes: No Genetic Screening: Declined Maternal Ultrasounds/Referrals: Other:Polydactyly Fetal Ultrasounds or other Referrals:  Referred to Materal Fetal Medicine  Maternal Substance Abuse:  No Significant Maternal Medications:   None Significant Maternal Lab Results:  Group B Strep negative Other Comments:  None  OB History  Gravida Para Term Preterm AB Living  5 3 2 1 1 2   SAB IAB Ectopic Multiple Live Births  1 0   0 2    # Outcome Date GA Lbr Len/2nd Weight Sex Delivery Anes PTL Lv  5 Current           4 Preterm 03/29/20 [redacted]w[redacted]d    Vag-Spont   FD  3 Term 09/02/18 [redacted]w[redacted]d 14:20 / 00:16 3735 g M Vag-Vacuum EPI  LIV  2 Term 02/23/08 [redacted]w[redacted]d  4082 g F Vag-Spont   LIV  1 SAB 2006            Medical / Surgical History: Past medical history:  Past Medical History:  Diagnosis Date   Chlamydia    Ovarian cyst     Past surgical history:  Past Surgical History:  Procedure Laterality Date   INDUCED ABORTION     NO PAST SURGERIES     Family History:  Family History  Problem Relation Age of Onset   Diabetes Paternal Grandfather    Stroke Paternal Grandfather    Healthy Mother    Lupus Father    Heart disease Maternal Grandmother    Hypertension Maternal Grandmother     Social History:  reports that she has never smoked. She has never used smokeless tobacco. She reports that she does not currently use alcohol. She reports that she does not use drugs.  Allergies: Patient has no known allergies.   Current Medications at time of admission:  Prior to Admission medications   Medication Sig Start Date End Date Taking? Authorizing Provider  iron polysaccharides (NIFEREX) 150 MG capsule Take 1 capsule (  150 mg total) by mouth daily. 09/03/18  Yes Dillard, Gwynneth Munson, MD  Prenatal Vit-Fe Fumarate-FA (PRENATAL MULTIVITAMIN) TABS tablet Take 1 tablet by mouth daily at 12 noon. 09/04/18  Yes Dillard, Gwynneth Munson, MD    Review of Systems: Constitutional: Negative   HENT: Negative   Eyes: Negative   Respiratory: Negative   Cardiovascular: Negative   Gastrointestinal: Negative  Genitourinary: POS for bloody show, neg for LOF   Musculoskeletal: Negative   Skin: Negative   Neurological: Negative   Endo/Heme/Allergies:  Negative   Psychiatric/Behavioral: Negative    Physical Exam: VS: Blood pressure (!) 147/97, pulse 86, temperature 98 F (36.7 C), temperature source Oral, resp. rate 18, height 5\' 5"  (1.651 m), weight 109.9 kg, last menstrual period 01/16/2021, SpO2 99 %, unknown if currently breastfeeding. AAO x3, no signs of distress Cardiovascular: RRR Respiratory: Lung fields clear to ausculation GU/GI: Abdomen gravid, non-tender, non-distended, active FM, vertex, EFW 3800 grams per Leopold's Extremities: + edema, negative for pain, tenderness, and cords  Cervical exam:Dilation: 5 Effacement (%): 90 Station: 0 Exam by:: Dr. Alwyn Pea Sterile speculum examination done - no HSV lesions seen FHR: baseline rate 140 / variability moderate / accelerations present / absent decelerations TOCO: irregular  Most recent growth ultrasound 09/21/21: Lagging BPD 5% HC 8% SIUP vertex posterior placenta AFI 19  EFW 3397 grams (7lbs 8oz) 76%  BPP8/8  CBC    Component Value Date/Time   WBC 12.3 (H) 10/08/2021 0334   RBC 4.28 10/08/2021 0334   HGB 8.9 (L) 10/08/2021 0334   HCT 29.7 (L) 10/08/2021 0334   PLT 205 10/08/2021 0334   MCV 69.4 (L) 10/08/2021 0334   MCH 20.8 (L) 10/08/2021 0334   MCHC 30.0 10/08/2021 0334   RDW 16.3 (H) 10/08/2021 0334  CMP     Component Value Date/Time   NA 139 10/08/2021 0334   K 3.1 (L) 10/08/2021 0334   CL 110 10/08/2021 0334   CO2 22 10/08/2021 0334   GLUCOSE 95 10/08/2021 0334   BUN 6 10/08/2021 0334   CREATININE 0.67 10/08/2021 0334   CALCIUM 9.3 10/08/2021 0334   PROT 6.6 10/08/2021 0334   ALBUMIN 2.8 (L) 10/08/2021 0334   AST 16 10/08/2021 0334   ALT 11 10/08/2021 0334   ALKPHOS 104 10/08/2021 0334   BILITOT 0.7 10/08/2021 0334   GFRNONAA >60 10/08/2021 0334   GFRAA >60 09/02/2018 0130    Creatinine, Urine mg/dL 300.02  340.05  285.07   Total Protein, Urine mg/dL 38  53 CM  29 CM   Comment: NO NORMAL RANGE ESTABLISHED FOR THIS TEST  Protein Creatinine Ratio  0.00 - 0.15 mg/mg 0.13  0.16 High  CM  0.10         Assessment: 36 y.o. [id number] [redacted]w[redacted]d admitted for augmentation of labor for gestational hypertension  active stage of labor FHR category 1 GBS neg Pain management plan: wants epidural   Plan:  Admit to Labor and Delivery Routine admission orders [place] labs done and are neg Epidural on maternal demand IV hydration Continuous monitoring Pitocin for labor augmentation Anticipate  NSVD delivery    Sanjuana Kava MD 10/08/2021 6:29 AM

## 2021-10-08 NOTE — MAU Provider Note (Signed)
History     CSN: [id number]  Arrival date and time: 10/08/21 0004   Event Date/Time   First Provider Initiated Contact with Patient 10/08/21 0049      Chief Complaint  Patient presents with   Contractions   Labor Eval   Bridget Summers is a 36 y.o. [id number] at [redacted]w[redacted]d who receives care at [facility].  She presents today for Contractions and Labor Eval.  However, upon evaluation patient found to have elevated blood pressure.  Patient denies HA, visual disturbances, RUQ pain, or SOB.  Patient reports good fetal movement and some bloody show.  Patient states contractions are present.  She denies issues during the pregnancy.    OB History     Gravida  4   Para  2   Term  2   Preterm      AB  1   Living  2      SAB      IAB  1   Ectopic      Multiple  0   Live Births  2           Past Medical History:  Diagnosis Date   Chlamydia    Ovarian cyst     Past Surgical History:  Procedure Laterality Date   INDUCED ABORTION     NO PAST SURGERIES      Family History  Problem Relation Age of Onset   Diabetes Paternal Grandfather    Stroke Paternal Grandfather    Healthy Mother    Lupus Father    Heart disease Maternal Grandmother    Hypertension Maternal Grandmother     Social History   Tobacco Use   Smoking status: Never   Smokeless tobacco: Never  Vaping Use   Vaping Use: Never used  Substance Use Topics   Alcohol use: Not Currently   Drug use: No    Allergies: No Known Allergies  Medications Prior to Admission  Medication Sig Dispense Refill Last Dose   iron polysaccharides (NIFEREX) 150 MG capsule Take 1 capsule (150 mg total) by mouth daily. 30 capsule 2 10/07/2021   Prenatal Vit-Fe Fumarate-FA (PRENATAL MULTIVITAMIN) TABS tablet Take 1 tablet by mouth daily at 12 noon. 30 tablet 3 10/07/2021    Review of Systems  Eyes:  Negative for visual disturbance.  Gastrointestinal:  Positive for nausea (Sunday Morning, None currently).  Negative for vomiting.  Genitourinary:  Positive for vaginal bleeding (Bloody Show).  Neurological:  Negative for dizziness, light-headedness and headaches.  Physical Exam   Blood pressure (!) 145/82, pulse 93, temperature 98 F (36.7 C), temperature source Oral, resp. rate 18, height 5\' 5"  (1.651 m), weight 109.9 kg, last menstrual period 01/16/2021, unknown if currently breastfeeding. Vitals:   10/08/21 0033 10/08/21 0045 10/08/21 0100 10/08/21 0115  BP: (!) 145/82 (!) 163/81 (!) 147/78 132/76  Pulse: 93 87 86 76  Resp: 18     Temp: 98 F (36.7 C)     TempSrc: Oral     SpO2:  100% 98% 99%  Weight: 109.9 kg     Height: 5\' 5"  (1.651 m)       Physical Exam Vitals reviewed.  Constitutional:      Appearance: Normal appearance.  HENT:     Head: Normocephalic and atraumatic.  Eyes:     Conjunctiva/sclera: Conjunctivae normal.  Pulmonary:     Effort: Pulmonary effort is normal. No respiratory distress.  Musculoskeletal:     Cervical back: Normal range of motion.  Neurological:     Mental Status: She is alert and oriented to person, place, and time.  Psychiatric:        Mood and Affect: Mood normal.        Behavior: Behavior normal.    MAU Course  Procedures No results found for this or any previous visit (from the past 24 hour(s)).  MDM PC Ratio Monitoring Assessment and Plan  36 year old, [id number]  SIUP at 39.5 weeks Elevated BP Contractions  -Reviewed POC with patient. -Discussed obtaining PC Ratio  -Informed that if BP becomes severe will admit. -Monitor and contact primary MD as appropriate -Nurse to recheck cervix in 2 hours.   Cherre Robins 10/08/2021, 12:49 AM   Reassessment (2:30 AM)  -Nurse reports cervical exam with some change. -BP remain elevated. -Nurse instructed to contact primary ob and notify of elevated bp and likely active labor.  Cherre Robins MSN, CNM Advanced Practice Provider, [facility]

## 2021-10-08 NOTE — Anesthesia Postprocedure Evaluation (Signed)
Anesthesia Post Note  Patient: Bridget Summers  Procedure(s) Performed: CESAREAN SECTION     Patient location during evaluation: PACU Anesthesia Type: Spinal Level of consciousness: awake and alert Pain management: pain level controlled Vital Signs Assessment: post-procedure vital signs reviewed and stable Respiratory status: spontaneous breathing, nonlabored ventilation and respiratory function stable Cardiovascular status: blood pressure returned to baseline Postop Assessment: no apparent nausea or vomiting, spinal receding, no headache and no backache Anesthetic complications: no   No notable events documented.  Last Vitals:  Vitals:   10/08/21 0845 10/08/21 0900  BP: 126/79 110/71  Pulse: 75 68  Resp: (!) 22 (!) 21  Temp: 36.5 C   SpO2: 97% 94%    Last Pain:  Vitals:   10/08/21 0900  TempSrc:   PainSc: 0-No pain   Pain Goal:    LLE Motor Response: Purposeful movement (10/08/21 0900) LLE Sensation: Tingling (10/08/21 0900) RLE Motor Response: Purposeful movement (10/08/21 0900) RLE Sensation: Tingling (10/08/21 0900) L Sensory Level: T2-inner arm and shoulder blade (10/08/21 0845) R Sensory Level: T2-Inner arm and shoulder blade (10/08/21 0845) Epidural/Spinal Function Cutaneous sensation: Tingles (10/08/21 0900), Patient able to flex knees: Yes (10/08/21 0900), Patient able to lift hips off bed: No (10/08/21 0900), Back pain beyond tenderness at insertion site: No (10/08/21 0900), Progressively worsening motor and/or sensory loss: No (10/08/21 0900), Bowel and/or bladder incontinence post epidural: No (10/08/21 0900)  Shanda Howells

## 2021-10-08 NOTE — Op Note (Signed)
Cesarean Section Procedure Note  Indications: Sudden episode of fetal bradycardia nadir 50's on Labor and Delivery.  AROM performed and FSE confirmed the fetal hearbeat in the 50's-60s. Patient position was changed several times without recovery so a "code cesarean" was called.  The fetal heartbeat had slow recovery from 95 to 100 preoperatively in the Operating room.  Pre-operative Diagnosis:  1. 39 week 5 day gestation 2. Gestational hypertension 3. 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 The patient was taken to Operating Room B identified as Bridget Summers and the procedure verified as C-Section Delivery. A Time Out was held and the above information confirmed.  After spinal anesthesia was found to be adequate, the patient was placed in the dorsal supine position with a leftward tilt, the fetal heart beat was in the 100's. The abdomen was prepped with Chloraprep and the vagina with betadine. Foley indwelling catheter was placed into the bladder. Patient draped in sterile fashion once the prep dried. A Pfannenstiel incision was made and carried down through the subcutaneous tissue to the fascia.  The fascia was incised in the midline and the fascial incision was opened bluntly followed by opening the abdominal peritoneum bluntly using surgeons fingers. The Alexis retractor was then deployed. The scalpel was then used to make a low transverse incision on the uterus which was extended laterally with  blunt dissection. The fluid appeared bloody suggestive of an abruption.   The fetal vertex was identified [ in direct OP position] The head delivered with Dr. Annia Friendly pushing on the uterine fundus. However there was difficulty  getting the shoulders out so the uterine incision was extended laterally using bandage scissors. The cord was quickly double clamped and the infant handed over to the waiting pediatricians for resuscitation. At 38 a  live female infant was born APGARS: 8/9. Placenta was then delivered spontaneously, intact and appear normal (was sent to Pathology) , the uterus was cleared of all clot and debris. The uterine incision was repaired with #0 Monocryl in running locked fashion. A second imbricating suture was performed using the same suture.  Several interrupted sutures of 0 monocryl was used to make the incision was hemostatic.   Ovaries and tubes were inspected and normal.  Irrigation was performed using warm sterile saline.  Arista was placed over the incision. The Alexis retractor was removed. The abdominal cavity was cleared of all clot and debris. The abdominal peritoneum was reapproximated with 2-0 chromic  in a running fashion, the rectus muscles was reapproximated with #2 chromic in interrupted fashion. The fascia was closed with 0 Vicryl in a running fashion, starting from each end and meeting in the middle.  30 cc of 0.5% Marcaine was injected along each layer.   The subcuticular layer was irrigated and all bleeders cauterized.   This layer was re-approximated with 2-0 plain gut.   The skin was closed with 4-0 vicryl in a subcuticular fashion using a [name] needle. The incision was dressed with benzoine, steri strips a honeycomb dressing as well as a pressure dressing.   All sponge lap and needle counts were correct x3. Patient tolerated the procedure well and recovered in stable condition following the procedure.  Instrument, sponge, and needle counts were correct prior  the abdominal closure and at the conclusion of the case.   Findings: Live female infant, Apgars 8/9, bloody amniotic fluid, normal appearing placenta, normal uterus, bilateral tubes and ovaries  Estimated Blood Loss: 994 mL          Drains: Foley catheter  Urine output: 150 cc clear urine   IV fluids:2300 LR  Antibiotics: Ancef 2 GM   Specimens: Placenta to Pathology         Implants: none         Complications:  None; patient tolerated the procedure well.         Disposition: PACU - hemodynamically stable.   Essie Hart, MD  10/08/2021 10:34 AM

## 2021-10-08 NOTE — Anesthesia Procedure Notes (Addendum)
Spinal  Patient location during procedure: OR Start time: 10/08/2021 6:57 AM End time: 10/08/2021 7:00 AM Reason for block: surgical anesthesia Staffing Performed: anesthesiologist  Anesthesiologist: Leilani Able, MD Preanesthetic Checklist Completed: patient identified, IV checked, site marked, risks and benefits discussed, surgical consent, monitors and equipment checked, pre-op evaluation and timeout performed Spinal Block Patient position: sitting Prep: DuraPrep and site prepped and draped Patient monitoring: continuous pulse ox and blood pressure Approach: midline Location: L3-4 Injection technique: single-shot Needle Needle type: Pencan  Needle gauge: 24 G Needle length: 10 cm Needle insertion depth: 6 cm Assessment Sensory level: T4 Events: CSF return

## 2021-10-08 NOTE — Progress Notes (Signed)
Ran to bedside for acute fetal bradycardia.  AROM performed, bloody.  FSE placed, fetal heartbeat \\50s -60s  Decision made to call "code C-section  In OR FSE showed fetal heartbeat 100's   Patient sat for spinal   [facility]

## 2021-10-08 NOTE — MAU Note (Signed)
.  Bridget Summers is a 36 y.o. at [redacted]w[redacted]d here in MAU reporting: CTX since 1600 yesterday every 3-6 mins currently. PT states she has seen bloody show. Pt denies DFM, VB, LOF, abnormal discharge, PIH s/s, complication in the pregnancy, and recent intercourse.  GBS neg SVE 2 cm 2 weeks ago  Onset of complaint: 1600 yesterday Pain score: 8/10 Vitals:   10/08/21 0033  BP: (!) 145/82  Pulse: 93  Resp: 18  Temp: 98 F (36.7 C)     FHT:140  Lab orders placed from triage:

## 2021-10-08 NOTE — Progress Notes (Signed)
Subjective: Postpartum Day 0: Cesarean Delivery Patient reports no complaints. Her incisional pain is well controlled. She denies nausea, vomiting.   Objective: Vital signs in last 24 hours: Temp:  [96.9 F (36.1 C)-98.2 F (36.8 C)] 98.2 F (36.8 C) (05/22 1720) Pulse Rate:  [58-99] 81 (05/22 1720) Resp:  [13-22] 18 (05/22 1720) BP: (101-163)/(55-103) 134/79 (05/22 1720) SpO2:  [94 %-100 %] 99 % (05/22 1720) Weight:  [109.9 kg] 109.9 kg (05/22 0033)    10/08/2021    5:20 PM 10/08/2021    1:00 PM 10/08/2021   11:58 AM  Vitals with BMI  Systolic 134 125 681  Diastolic 79 72 76  Pulse 81 71 58    Physical Exam:  General: alert, cooperative, and no distress Lochia: appropriate Uterine Fundus: firm Incision: no significant drainage DVT Evaluation: No evidence of DVT seen on physical exam. Calf/Ankle edema is present.  Recent Labs    10/08/21 0334 10/08/21 0857  HGB 8.9* 8.1*  HCT 29.7* 27.2*    Assessment/Plan: 36 y/o POD #0 s/p Emergency Cesarean section for fetal bradycardia, POD # 0. Doing well postoperatively.  Continue current care. Prescilla Sours, MD.  10/08/2021, 10:20 AM

## 2021-10-08 NOTE — Anesthesia Preprocedure Evaluation (Signed)
Anesthesia Evaluation  Patient identified by MRN, date of birth, ID band Patient awake  Preop documentation limited or incomplete due to emergent nature of procedure.  Airway Mallampati: Unable to assess       Dental no notable dental hx.    Pulmonary    Pulmonary exam normal        Cardiovascular hypertension,  Rate:Tachycardia     Neuro/Psych    GI/Hepatic   Endo/Other  Morbid obesity  Renal/GU      Musculoskeletal   Abdominal (+) + obese,   Peds  Hematology  (+) Blood dyscrasia, anemia ,   Anesthesia Other Findings   Reproductive/Obstetrics (+) Pregnancy                             Anesthesia Physical Anesthesia Plan  ASA: 3 and emergent  Anesthesia Plan: Spinal   Post-op Pain Management:    Induction:   PONV Risk Score and Plan: 3 and Ondansetron, Dexamethasone and Scopolamine patch - Pre-op  Airway Management Planned: Natural Airway, Simple Face Mask and Nasal Cannula  Additional Equipment:   Intra-op Plan:   Post-operative Plan:   Informed Consent: I have reviewed the patients History and Physical, chart, labs and discussed the procedure including the risks, benefits and alternatives for the proposed anesthesia with the patient or authorized representative who has indicated his/her understanding and acceptance.       Plan Discussed with: CRNA  Anesthesia Plan Comments: (Stat C/S for fetal decelerations and variable decels. FHR range in OR prior to spinal was 76-126.)        Anesthesia Quick Evaluation

## 2021-10-08 NOTE — Progress Notes (Signed)
Pt has been encouraged to eat something for last several hours but pt has not felt like eating anything. NO N/V .Pt will try "panera" shortly.  Pt ambulated to BR,performed a "birdbath" independently. And returned to bed. Pt described a little lightheaded at very end of toileting

## 2021-10-08 NOTE — Lactation Note (Signed)
This note was copied from a baby's chart. Lactation Consultation Note  Patient Name: Bridget Summers [id number] Date: 10/08/2021 Reason for consult: Initial assessment;Term;Breastfeeding assistance Age:36 hours  LC entered the room and baby was asleep in the bassinet. Mom was on the phone, but stated that [facility] could still speak with her. Mom states that she had an emergency C-section and has not latched baby. She says that they are getting rest, so they gave baby 5 ml of formula. LC asked mom if she had any previous breastfeeding experience. She stated that she breast fed her toddler for 1 year.   LC asked mom if she had been taught how to hand express. She states that she was taught to hand express. LC reviewed lactation services brochure and encouraged mom to call for lactation when baby is showing feeding cues.   Mom states that she will call lactation when baby is ready to feed.   Maternal Data Has patient been taught Hand Expression?: Yes Does the patient have breastfeeding experience prior to this delivery?: Yes (Per mom she breastfed for 1 year with her toddler) How long did the patient breastfeed?: 12 months  Feeding Mother's Current Feeding Choice: Breast Milk and Formula Nipple Type: Slow - flow   Consult Status Consult Status: Follow-up Date: 10/08/21 Follow-up type: In-patient    Delene Loll 10/08/2021, 11:41 AM

## 2021-10-08 NOTE — Lactation Note (Signed)
This note was copied from a baby's chart. Lactation Consultation Note  Patient Name: Boy Aneesia Lausier [id number] Date: 10/08/2021 Reason for consult: Follow-up assessment;Mother's request;Term;Breastfeeding assistance Age:36 years  RN called LC to the room. Mom stated that baby was ready to latch. Mom sat up and looked in her bag. Mom said that she got dizzy for a second. LC sat the bed up for mom at her request and mom said that she felt better. LC assisted mom with latching baby to her left breast in the cross cradle hold. Baby fussy for a few min. After calming down baby latched deeply with flanged lips and a few swallows were noted. Mom stated that the latch was comfortable.   The parents asked if [facility] could get them some pillows, Enfamil, and nipples. The Nurse tech assisted LC with getting 2 bottles of Enfamil and 2 yellow nipples for the parents. The parents asked if we gave out pacifiers. [place] educated parents about pacifier usage.  Current Feeding Plan Mom will latch baby according to feeding cues 8+ times per day.  Mom will latch baby first prior bottle feeding baby formula.  Mom will feed baby STS and be STS with baby often.  Mom will call for latch assistance if needed.  Maternal Data Has patient been taught Hand Expression?: Yes Does the patient have breastfeeding experience prior to this delivery?: Yes (Per mom she breastfed for 1 year with her toddler) How long did the patient breastfeed?: 12 months  Feeding Mother's Current Feeding Choice: Breast Milk and Formula  LATCH Score Latch: Repeated attempts needed to sustain latch, nipple held in mouth throughout feeding, stimulation needed to elicit sucking reflex.  Audible Swallowing: A few with stimulation  Type of Nipple: Everted at rest and after stimulation  Comfort (Breast/Nipple): Soft / non-tender  Hold (Positioning): Assistance needed to correctly position infant at breast and maintain latch.  LATCH Score:  7   Lactation Tools Discussed/Used    Interventions Interventions: Assisted with latch;Adjust position;Breast compression  Discharge    Consult Status Consult Status: Follow-up Date: 10/09/21 Follow-up type: In-patient    Elly Modena Argenis Kumari 10/08/2021, 1:11 PM

## 2021-10-08 NOTE — Transfer of Care (Signed)
Immediate Anesthesia Transfer of Care Note  Patient: Bridget Summers  Procedure(s) Performed: CESAREAN SECTION  Patient Location: PACU  Anesthesia Type:Spinal  Level of Consciousness: awake, alert  and oriented  Airway & Oxygen Therapy: Patient Spontanous Breathing  Post-op Assessment: Report given to RN and Post -op Vital signs reviewed and stable  Post vital signs: Reviewed and stable  Last Vitals:  Vitals Value Taken Time  BP 101/62 10/08/21 0815  Temp    Pulse 75 10/08/21 0818  Resp 16 10/08/21 0818  SpO2 99 % 10/08/21 0818  Vitals shown include unvalidated device data.  Last Pain:  Vitals:   10/08/21 0224  TempSrc:   PainSc: 8          Complications: No notable events documented.

## 2021-10-09 LAB — CBC
HCT: 22 % — ABNORMAL LOW (ref 36.0–46.0)
Hemoglobin: 7 g/dL — ABNORMAL LOW (ref 12.0–15.0)
MCH: 21.3 pg — ABNORMAL LOW (ref 26.0–34.0)
MCHC: 31.8 g/dL (ref 30.0–36.0)
MCV: 67.1 fL — ABNORMAL LOW (ref 80.0–100.0)
Platelets: 169 10*3/uL (ref 150–400)
RBC: 3.28 MIL/uL — ABNORMAL LOW (ref 3.87–5.11)
RDW: 15.9 % — ABNORMAL HIGH (ref 11.5–15.5)
WBC: 14.1 10*3/uL — ABNORMAL HIGH (ref 4.0–10.5)
nRBC: 0 % (ref 0.0–0.2)

## 2021-10-09 MED ORDER — SODIUM CHLORIDE 0.9 % IV SOLN
300.0000 mg | Freq: Once | INTRAVENOUS | Status: AC
  Administered 2021-10-09: 300 mg via INTRAVENOUS
  Filled 2021-10-09: qty 15

## 2021-10-09 MED ORDER — FERROUS SULFATE 325 (65 FE) MG PO TABS
325.0000 mg | ORAL_TABLET | Freq: Every day | ORAL | Status: DC
  Administered 2021-10-10 – 2021-10-11 (×2): 325 mg via ORAL
  Filled 2021-10-09 (×2): qty 1

## 2021-10-09 MED ORDER — SODIUM CHLORIDE 0.9 % IV SOLN
200.0000 mg | Freq: Once | INTRAVENOUS | Status: AC
  Administered 2021-10-09: 200 mg via INTRAVENOUS
  Filled 2021-10-09: qty 10

## 2021-10-09 NOTE — Lactation Note (Signed)
This note was copied from a baby's chart. Lactation Consultation Note  Patient Name: Boy Brandace Cargle [id number] Date: 10/09/2021 Reason for consult: Follow-up assessment Age:36 hours  P3, Mother is breastfeeding and formula feeding. Mother denies questions or concerns. Suggest mother call if she needs assistance.   Feeding Mother's Current Feeding Choice: Breast Milk and Formula   Interventions Interventions: Education  Discharge    Consult Status Consult Status: Follow-up Date: 10/10/21 Follow-up type: In-patient    Dahlia Byes [facility] 10/09/2021, 11:32 AM

## 2021-10-09 NOTE — Progress Notes (Signed)
Subjective: Postpartum Day 1: Cesarean Delivery Patient reports tolerating regular diet. She is ambulating and voiding without  difficulty. Her incisional pain is well controlled. She is breast and bottle feeding.  She denies lightheadedness or dizziness.   Objective: Vital signs in last 24 hours: Temp:  [98.2 F (36.8 C)-99.2 F (37.3 C)] 98.4 F (36.9 C) (05/23 1337) Pulse Rate:  [81-90] 84 (05/23 1337) Resp:  [16-18] 16 (05/23 1337) BP: (124-140)/(72-79) 124/72 (05/23 1337) SpO2:  [96 %-99 %] 97 % (05/23 1337)  Physical Exam:  General: alert, cooperative, and no distress Lochia: appropriate Uterine Fundus: firm Incision: no significant drainage DVT Evaluation: No evidence of DVT seen on physical exam. Calf/Ankle edema is present.  Recent Labs    10/08/21 0857 10/09/21 0552  HGB 8.1* 7.0*  HCT 27.2* 22.0*    Assessment/Plan: 36 y/o POD #1 s/p Emergency Cesarean section for fetal bradycardia. Pre and Postoperative course complicated by anemia , intra-op EBL was 994 cc, stable, - Management options discussed, she desires IV iron infusion. Discussed risks, benefits and alternatives of IV venofer transfusion including risks of allergic reactions. She is instructed to continue with oral iron supplements and iron rich foods at home.  -continue with current care.  Prescilla Sours, MD.  10/09/2021, 3:46 PM

## 2021-10-09 NOTE — Progress Notes (Signed)
Bridget Summers notified of Pts repeated BP 134/79 from 144/85 standing during orthostatic vital signs. No new orders received.

## 2021-10-10 LAB — SURGICAL PATHOLOGY

## 2021-10-10 MED ORDER — OXYCODONE-ACETAMINOPHEN 5-325 MG PO TABS
2.0000 | ORAL_TABLET | Freq: Three times a day (TID) | ORAL | Status: DC
Start: 2021-10-10 — End: 2021-10-11
  Administered 2021-10-10 – 2021-10-11 (×4): 2 via ORAL
  Filled 2021-10-10 (×4): qty 2

## 2021-10-10 MED ORDER — GABAPENTIN 100 MG PO CAPS
100.0000 mg | ORAL_CAPSULE | Freq: Three times a day (TID) | ORAL | Status: DC
  Administered 2021-10-10 – 2021-10-11 (×4): 100 mg via ORAL
  Filled 2021-10-10 (×4): qty 1

## 2021-10-10 NOTE — Progress Notes (Signed)
Subjective: POD# 2 Information for the patient's newborn:  Marlyne, [place] [[id number]]  female    Circumcision pending  Reports feeling sore and uncomfortable Feeding: breat and formula Reports tolerating PO and denies N/V, foley removed, ambulating and urinating w/o difficulty  Pain poorly controlled with  PO meds Denies HA/SOB/dizziness  Flatus passing Vaginal bleeding is normal, no clots     Objective:  VS:  Vitals:   10/09/21 0556 10/09/21 1337 10/09/21 2157 10/10/21 0626  BP: 140/78 124/72 (!) 139/92 134/79  Pulse: 90 84 88 88  Resp: 18 16 18    Temp: 98.9 F (37.2 C) 98.4 F (36.9 C) 98.9 F (37.2 C) 98.5 F (36.9 C)  TempSrc: Oral Oral Oral   SpO2: 96% 97%  98%  Weight:      Height:        Intake/Output Summary (Last 24 hours) at 10/10/2021 0742 Last data filed at 10/09/2021 0800 Gross per 24 hour  Intake --  Output 600 ml  Net -600 ml     Recent Labs    10/08/21 0857 10/09/21 0552  WBC 12.0* 14.1*  HGB 8.1* 7.0*  HCT 27.2* 22.0*  PLT 189 169    Blood type: --/--/A POS (05/22 0345) Rubella: Immune (10/31 0000)    Physical Exam:  General: alert, cooperative, and mild distress Resp: unlabored Abdomen: soft, non-distended Incision:  pressure dressing removed, honeycomb is clean, dry, and intact Perineum:  Uterine Fundus: firm, below umbilicus, nontender Lochia:  appropriate Ext:  neg for pain, tenderness, edema, and cords   Assessment/Plan: 36 y.o.   POD# 2. 31                  Principal Problem:   Gestational hypertension affecting fourth pregnancy Active Problems:   Normal labor   Gestational hypertension, third trimester   Prolonged pregnancy   Routine post-op PP care          Adjusted oral pain meds Anticipate D/C 10/11/21   10/13/21, MSN, CNM 10/10/2021, 7:42 AM

## 2021-10-10 NOTE — Lactation Note (Signed)
This note was copied from a baby's chart. Lactation Consultation Note  Patient Name: Bridget Summers [id number] Date: 10/10/2021   Age:36 hours LC did not observe latch, infant had SLP consult earlier today. Mom will continue to follow SLP feeding guidelines  P3, term female infant, per mom, she feels infant latches well at the breast, most feedings are 10, 15, 20 minutes, mom is supplementing with her EBM and formula. Mom started  pumping today she pumped twice and pumped 5 mls, LC encouraged mom to continue to pump every 3 hours for 15 minutes to help with her milk supply. Mom's plan is to continue to latch infant at breast, limit feedings to 30 minutes, supplement infant with her EBM first and then formula. Mom knows her EBM is safe for 4 hours at room temperature whereas formula is safe for 1 hour.  Mom was given breastfeeding supplemental guideline sheet mom knows if infant latch to offer 18-25 mls per feeding and if infant doesn't latch to offer 30 mls+ per feeding. Mom knows to call RN/LC if she needs latch assistance.  Maternal Data    Feeding Nipple Type: Nfant Extra Slow Flow (gold)  LATCH Score                    Lactation Tools Discussed/Used    Interventions    Discharge    Consult Status      Danelle Earthly 10/10/2021, 7:01 PM

## 2021-10-11 ENCOUNTER — Other Ambulatory Visit (HOSPITAL_COMMUNITY): Payer: Self-pay

## 2021-10-11 MED ORDER — ACETAMINOPHEN 500 MG PO TABS
1000.0000 mg | ORAL_TABLET | Freq: Four times a day (QID) | ORAL | 0 refills | Status: AC
Start: 2021-10-11 — End: ?

## 2021-10-11 MED ORDER — OXYCODONE-ACETAMINOPHEN 5-325 MG PO TABS
1.0000 | ORAL_TABLET | Freq: Three times a day (TID) | ORAL | 0 refills | Status: AC
  Filled 2021-10-11: qty 15, 5d supply, fill #0

## 2021-10-11 MED ORDER — GABAPENTIN 100 MG PO CAPS
100.0000 mg | ORAL_CAPSULE | Freq: Three times a day (TID) | ORAL | 0 refills | Status: DC
  Filled 2021-10-11: qty 15, 5d supply, fill #0

## 2021-10-11 MED ORDER — IBUPROFEN 600 MG PO TABS
600.0000 mg | ORAL_TABLET | Freq: Four times a day (QID) | ORAL | 0 refills | Status: DC
  Filled 2021-10-11: qty 30, 8d supply, fill #0

## 2021-10-11 NOTE — Discharge Summary (Addendum)
Postpartum Discharge Summary  Date of Service updated 10/11/21    Patient Name: Bridget Summers DOB: [birth year] MRN: [id number]  Date of admission: 10/08/2021 Delivery date:10/08/2021  Delivering provider: Sanjuana Kava  Date of discharge: 10/11/2021  Admitting diagnosis: Normal labor [O80, Z37.9] Gestational hypertension, third trimester [O13.3] Prolonged pregnancy [O48.1] Intrauterine pregnancy: 102w5d    Secondary diagnosis:  Principal Problem:   Gestational hypertension affecting fourth pregnancy Active Problems:   Gestational hypertension, third trimester   Postpartum care following cesarean delivery  Additional problems: none    Discharge diagnosis: Term Pregnancy Delivered and Gestational Hypertension                                              Post partum procedures: none Augmentation: N/A Complications: Fetal bradycardia  Hospital course: Onset of Labor With Unplanned C/S   36y.o. yo [id number] 37w5das admitted in [facility] 10/08/2021. Patient had a labor course significant for fetal bradycardia unresponsive to external resuscitation efforts and requiring an urgent section. The patient went for cesarean section due to Non-Reassuring FHR. Delivery details as follows: Membrane Rupture Time/Date:  ,   Delivery Method:C-Section, Low Transverse  Details of operation can be found in separate operative note. Patient had an uncomplicated postpartum course.  She is ambulating,tolerating a regular diet, passing flatus, and urinating well.  Patient is discharged home in stable condition 10/11/21.  Newborn Data: Birth date:10/08/2021  Birth time:7:10 AM  Gender:Female  Living status:Living  Apgars:8 ,9  Weight:3750 g   Magnesium Sulfate received: No BMZ received: No Rhophylac:N/A MMR:N/A T-DaP: declined Flu: No Transfusion: IV Iron infusion (venofer)  Physical exam  Vitals:   10/10/21 1504 10/10/21 1555 10/10/21 2100 10/11/21 0522  BP: (!) 157/94 134/82 134/80  132/83  Pulse: 83 82 79 80  Resp: _0 Temp: 98.6 F (37 C)  98.3 F (36.8 C) 98.1 F (36.7 C)  TempSrc: Oral  Oral Oral  SpO2: 99%  99% 98%  Weight:      Height:       General: alert, cooperative, and no distress Lochia: appropriate Uterine Fundus: firm Incision: Healing well with no significant drainage, Dressing is clean, dry, and intact DVT Evaluation: No evidence of DVT seen on physical exam. No cords or calf tenderness. No significant calf/ankle edema. Labs: Lab Results  Component Value Date   WBC 14.1 (H) 10/09/2021   HGB 7.0 (L) 10/09/2021   HCT 22.0 (L) 10/09/2021   MCV 67.1 (L) 10/09/2021   PLT 169 10/09/2021      Latest Ref Rng & Units 10/08/2021    3:34 AM  CMP  Glucose 70 - 99 mg/dL 95    BUN 6 - 20 mg/dL 6    Creatinine 0.44 - 1.00 mg/dL 0.67    Sodium 135 - 145 mmol/L 139    Potassium 3.5 - 5.1 mmol/L 3.1    Chloride 98 - 111 mmol/L 110    CO2 22 - 32 mmol/L 22    Calcium 8.9 - 10.3 mg/dL 9.3    Total Protein 6.5 - 8.1 g/dL 6.6    Total Bilirubin 0.3 - 1.2 mg/dL 0.7    Alkaline Phos 38 - 126 U/L 104    AST 15 - 41 U/L 16    ALT 0 - 44 U/L 11  Edinburgh Score:    10/08/2021    9:42 AM  Flavia Shipper Postnatal Depression Scale Screening Tool  I have been able to laugh and see the funny side of things. 0  I have looked forward with enjoyment to things. 0  I have blamed myself unnecessarily when things went wrong. 1  I have been anxious or worried for no good reason. 0  I have felt scared or panicky for no good reason. 0  Things have been getting on top of me. 0  I have been so unhappy that I have had difficulty sleeping. 0  I have felt sad or miserable. 0  I have been so unhappy that I have been crying. 0  The thought of harming myself has occurred to me. 0  Edinburgh Postnatal Depression Scale Total 1      After visit meds:  Allergies as of 10/11/2021   No Known Allergies      Medication List     TAKE these medications     acetaminophen 500 MG tablet Commonly known as: TYLENOL Take 2 tablets (1,000 mg total) by mouth every 6 (six) hours.   gabapentin 100 MG capsule Commonly known as: NEURONTIN Take 1 capsule (100 mg total) by mouth every 8 (eight) hours for 5 days.   ibuprofen 600 MG tablet Commonly known as: ADVIL Take 1 tablet (600 mg total) by mouth every 6 (six) hours.   iron polysaccharides 150 MG capsule Commonly known as: NIFEREX Take 1 capsule (150 mg total) by mouth daily.   oxyCODONE-acetaminophen 5-325 MG tablet Commonly known as: PERCOCET/ROXICET Take 1 tablet by mouth every 8 (eight) hours for 5 days.   prenatal multivitamin Tabs tablet Take 1 tablet by mouth daily at 12 noon.               Discharge Care Instructions  (From admission, onward)           Start     Ordered   10/11/21 0000  Discharge wound care:       Comments: Take dressing off on day 5-7 postpartum.  Report increased drainage, redness or warmth. Clean with water, let soap trickle down body. Can leave steri strips on until they fall off or take them off gently at day 10. Keep open to air, clean and dry.   10/11/21 0753             Discharge home in stable condition Infant Feeding: Breast and formula Infant Disposition:home with mother Discharge instruction: per After Visit Summary and Postpartum booklet. Activity: Advance as tolerated. Pelvic rest for 6 weeks.  Diet: low salt diet Anticipated Birth Control: IUD or patch Postpartum Appointment:6 weeks Additional Postpartum F/U: BP check 1 week Future Appointments:No future appointments. Follow up Visit:  [facility] Follow up.   Specialty: Obstetrics and Gynecology Why: Return to [facility] in 1 week for BP check and then in 6 weeks for regular postpartum visit. Contact information: [address] [place] [address] [phone]                     10/11/2021 Arrie Eastern, CNM

## 2021-10-11 NOTE — Lactation Note (Addendum)
This note was copied from a baby's chart. Lactation Consultation Note  Patient Name: Bridget Summers ZSMOL'M Date: 10/11/2021 - 4 % weight loss  Reason for consult: Follow-up assessment;Term;Other (Comment) (LC updated the doc flow sheets per  mom. The baby is post circ and has been sleepy. mom aware of the progresssion of the nipples from speech. LC recommended considering a F/U with LC O/P and mom has the resource sheet.) Age:36 hours Per mom Speech has been into see baby and mom this am per mom.  Per mom breast / formula and plans to continue when she goes home.  BF D/C teaching completed with mom.    Maternal Data    Feeding Mother's Current Feeding Choice: Breast Milk and Formula  LATCH Score                    Lactation Tools Discussed/Used    Interventions    Discharge Discharge Education: Engorgement and breast care;Warning signs for feeding baby;Outpatient recommendation Pump: DEBP;Manual;Personal  Consult Status Consult Status: Complete Date: 10/11/21    Kathrin Greathouse 10/11/2021, 1:11 PM

## 2021-10-16 ENCOUNTER — Inpatient Hospital Stay (HOSPITAL_COMMUNITY): Payer: Medicaid Other

## 2021-10-16 ENCOUNTER — Inpatient Hospital Stay (HOSPITAL_COMMUNITY)
Admission: AD | Admit: 2021-10-16 | Payer: Medicaid Other | Source: Home / Self Care | Admitting: Obstetrics & Gynecology

## 2021-10-16 ENCOUNTER — Telehealth (HOSPITAL_COMMUNITY): Payer: Self-pay

## 2021-10-16 NOTE — Telephone Encounter (Signed)
No answer. Left message to return nurse call.  Marcelino Duster [facility] 05/30//2023,1912

## 2021-12-04 DIAGNOSIS — O139 Gestational [pregnancy-induced] hypertension without significant proteinuria, unspecified trimester: Secondary | ICD-10-CM | POA: Diagnosis not present

## 2021-12-04 DIAGNOSIS — N760 Acute vaginitis: Secondary | ICD-10-CM | POA: Diagnosis not present

## 2021-12-04 DIAGNOSIS — N898 Other specified noninflammatory disorders of vagina: Secondary | ICD-10-CM | POA: Diagnosis not present

## 2022-03-18 DIAGNOSIS — F411 Generalized anxiety disorder: Secondary | ICD-10-CM | POA: Diagnosis not present

## 2022-03-18 DIAGNOSIS — F330 Major depressive disorder, recurrent, mild: Secondary | ICD-10-CM | POA: Diagnosis not present

## 2022-03-25 DIAGNOSIS — F330 Major depressive disorder, recurrent, mild: Secondary | ICD-10-CM | POA: Diagnosis not present

## 2022-03-25 DIAGNOSIS — F411 Generalized anxiety disorder: Secondary | ICD-10-CM | POA: Diagnosis not present

## 2022-04-08 DIAGNOSIS — F411 Generalized anxiety disorder: Secondary | ICD-10-CM | POA: Diagnosis not present

## 2022-04-08 DIAGNOSIS — F330 Major depressive disorder, recurrent, mild: Secondary | ICD-10-CM | POA: Diagnosis not present

## 2022-04-22 DIAGNOSIS — F411 Generalized anxiety disorder: Secondary | ICD-10-CM | POA: Diagnosis not present

## 2022-04-22 DIAGNOSIS — F330 Major depressive disorder, recurrent, mild: Secondary | ICD-10-CM | POA: Diagnosis not present

## 2022-05-06 DIAGNOSIS — F411 Generalized anxiety disorder: Secondary | ICD-10-CM | POA: Diagnosis not present

## 2022-05-06 DIAGNOSIS — F330 Major depressive disorder, recurrent, mild: Secondary | ICD-10-CM | POA: Diagnosis not present

## 2022-06-03 DIAGNOSIS — F330 Major depressive disorder, recurrent, mild: Secondary | ICD-10-CM | POA: Diagnosis not present

## 2022-06-03 DIAGNOSIS — F411 Generalized anxiety disorder: Secondary | ICD-10-CM | POA: Diagnosis not present

## 2022-06-17 DIAGNOSIS — F330 Major depressive disorder, recurrent, mild: Secondary | ICD-10-CM | POA: Diagnosis not present

## 2022-06-17 DIAGNOSIS — F411 Generalized anxiety disorder: Secondary | ICD-10-CM | POA: Diagnosis not present

## 2022-07-09 DIAGNOSIS — Z3009 Encounter for other general counseling and advice on contraception: Secondary | ICD-10-CM | POA: Diagnosis not present

## 2022-07-09 DIAGNOSIS — Z113 Encounter for screening for infections with a predominantly sexual mode of transmission: Secondary | ICD-10-CM | POA: Diagnosis not present

## 2022-07-09 DIAGNOSIS — Z8679 Personal history of other diseases of the circulatory system: Secondary | ICD-10-CM | POA: Diagnosis not present

## 2022-07-09 DIAGNOSIS — R300 Dysuria: Secondary | ICD-10-CM | POA: Diagnosis not present

## 2022-07-09 DIAGNOSIS — Z3040 Encounter for surveillance of contraceptives, unspecified: Secondary | ICD-10-CM | POA: Diagnosis not present

## 2022-07-15 DIAGNOSIS — F411 Generalized anxiety disorder: Secondary | ICD-10-CM | POA: Diagnosis not present

## 2022-07-15 DIAGNOSIS — F330 Major depressive disorder, recurrent, mild: Secondary | ICD-10-CM | POA: Diagnosis not present

## 2022-07-29 DIAGNOSIS — F411 Generalized anxiety disorder: Secondary | ICD-10-CM | POA: Diagnosis not present

## 2022-07-29 DIAGNOSIS — F330 Major depressive disorder, recurrent, mild: Secondary | ICD-10-CM | POA: Diagnosis not present

## 2022-08-12 DIAGNOSIS — F411 Generalized anxiety disorder: Secondary | ICD-10-CM | POA: Diagnosis not present

## 2022-08-12 DIAGNOSIS — F330 Major depressive disorder, recurrent, mild: Secondary | ICD-10-CM | POA: Diagnosis not present

## 2022-08-26 DIAGNOSIS — F411 Generalized anxiety disorder: Secondary | ICD-10-CM | POA: Diagnosis not present

## 2022-08-26 DIAGNOSIS — F330 Major depressive disorder, recurrent, mild: Secondary | ICD-10-CM | POA: Diagnosis not present

## 2022-09-09 DIAGNOSIS — F330 Major depressive disorder, recurrent, mild: Secondary | ICD-10-CM | POA: Diagnosis not present

## 2022-09-09 DIAGNOSIS — F411 Generalized anxiety disorder: Secondary | ICD-10-CM | POA: Diagnosis not present

## 2022-09-23 DIAGNOSIS — F411 Generalized anxiety disorder: Secondary | ICD-10-CM | POA: Diagnosis not present

## 2022-09-23 DIAGNOSIS — F330 Major depressive disorder, recurrent, mild: Secondary | ICD-10-CM | POA: Diagnosis not present

## 2022-10-05 IMAGING — US US MFM OB TRANSVAGINAL
1 series · 15 of 26 positions shown · non-contrast
Comparison: none

[Series 1: us mfm ob transvaginal · 26 acquisitions, 15 frames shown]
[im 1/26]
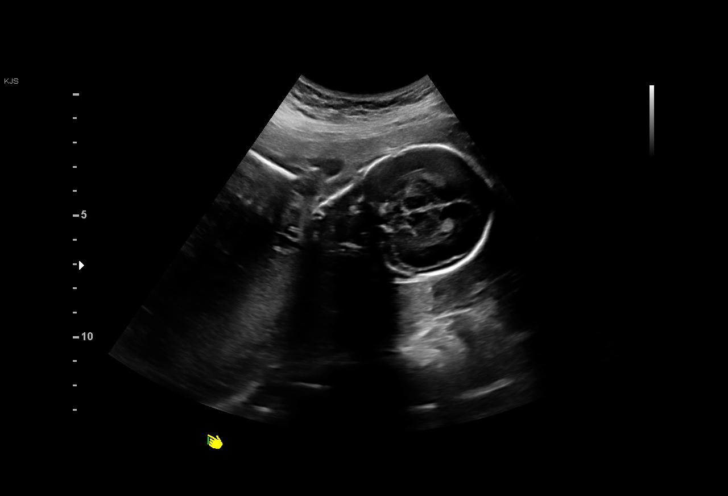
[im 3/26]
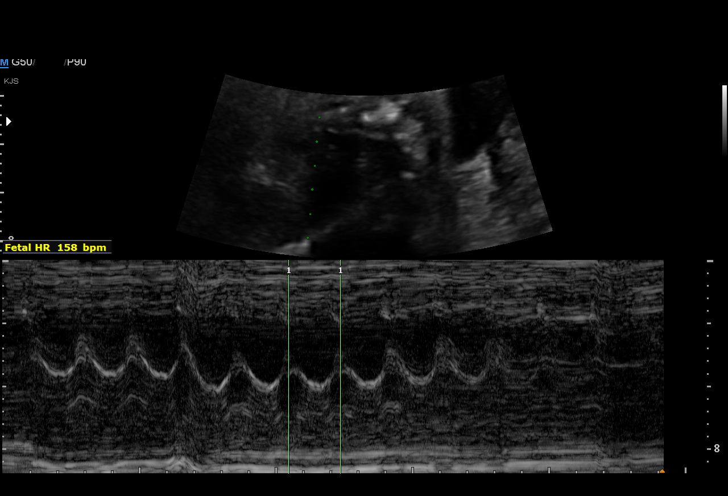
[im 5/26]
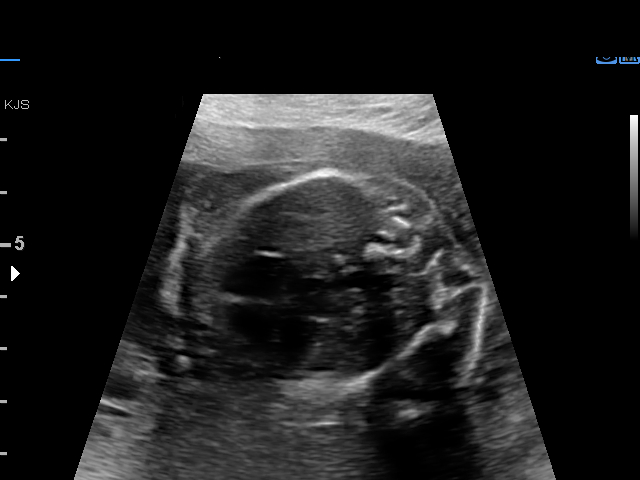
[im 7/26]
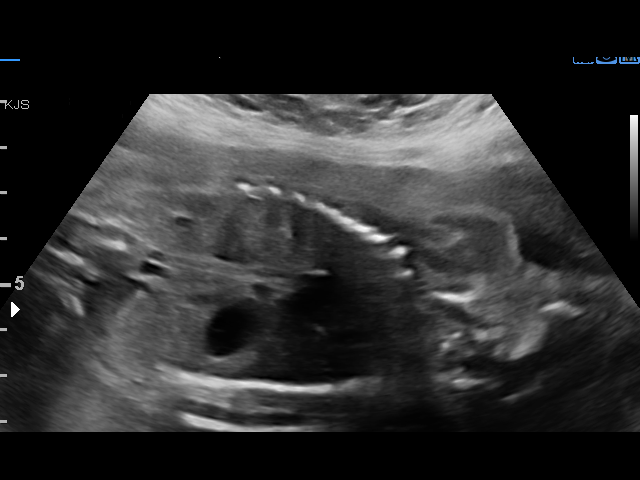
[im 8/26]
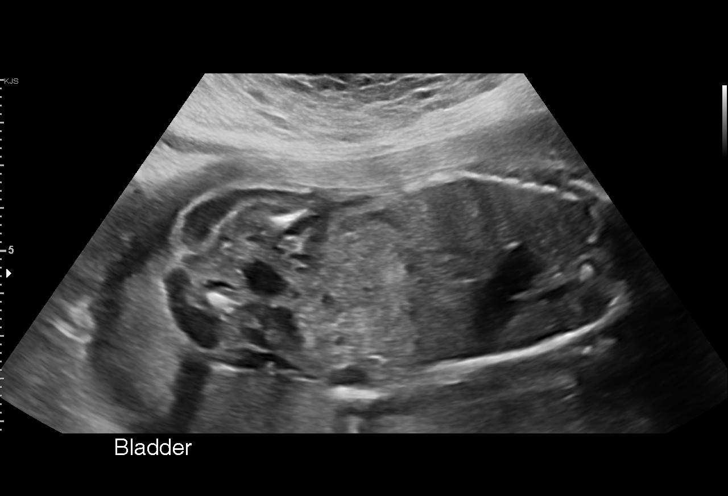
[im 10/26]
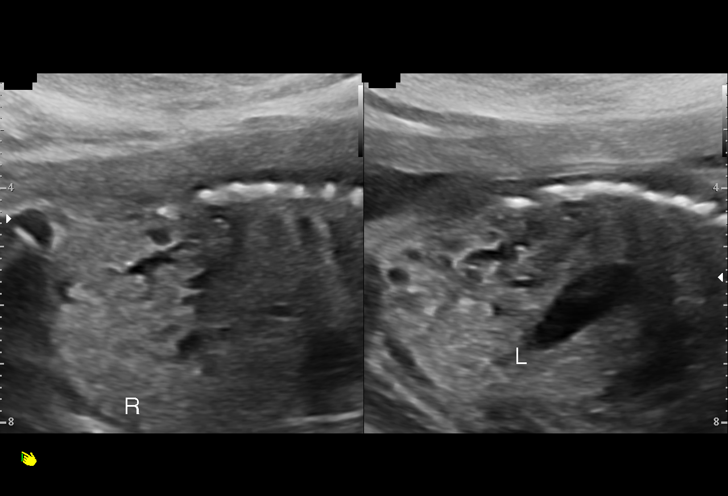
[im 12/26]
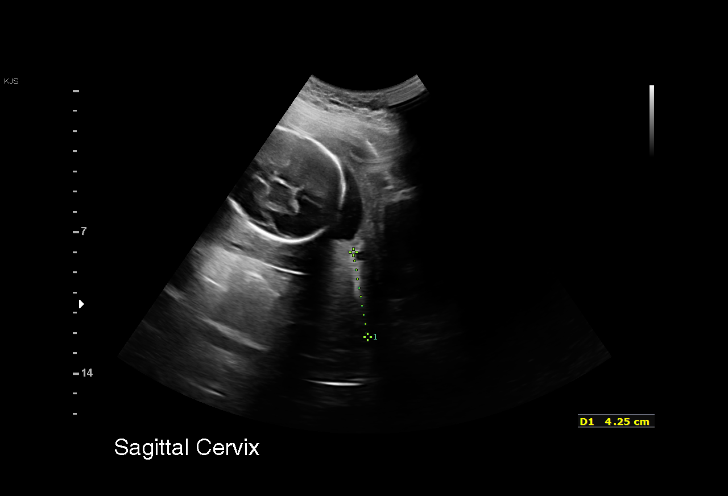
[im 14/26]
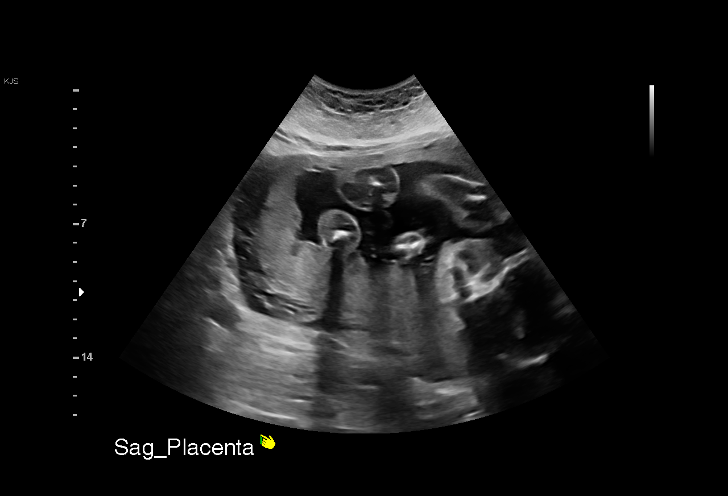
[im 15/26]
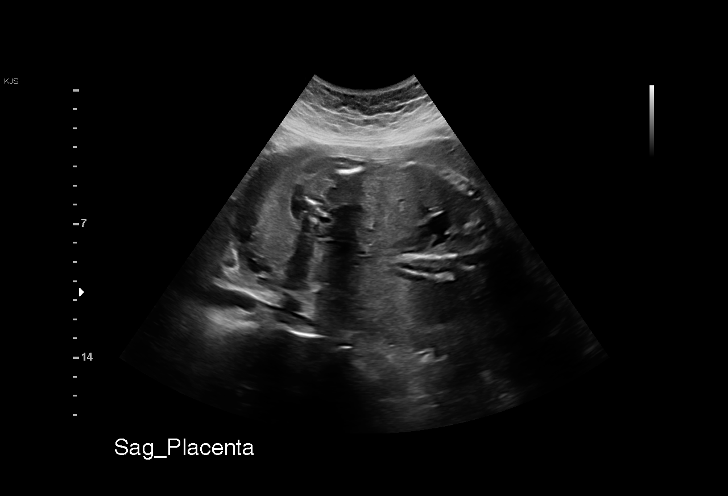
[im 17/26]
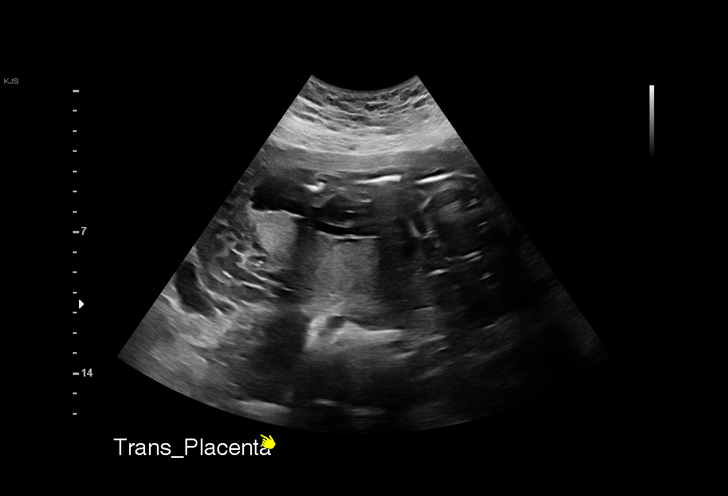
[im 19/26]
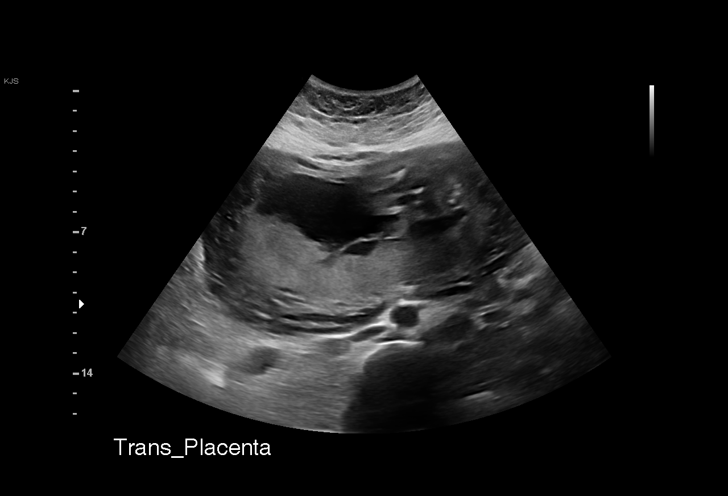
[im 20/26]
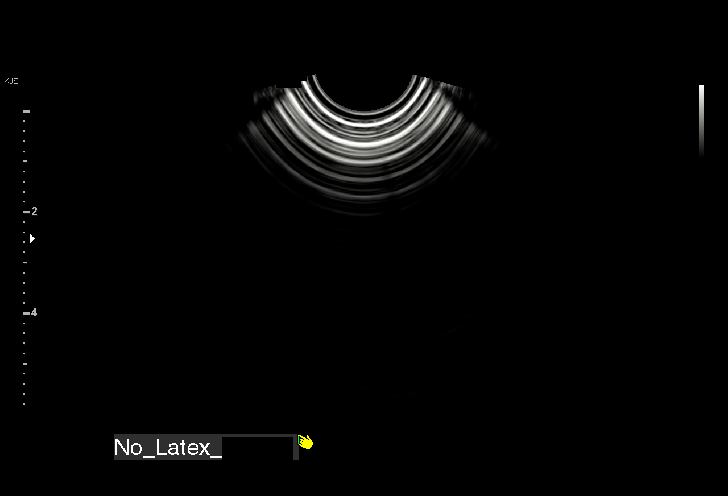
[im 22/26]
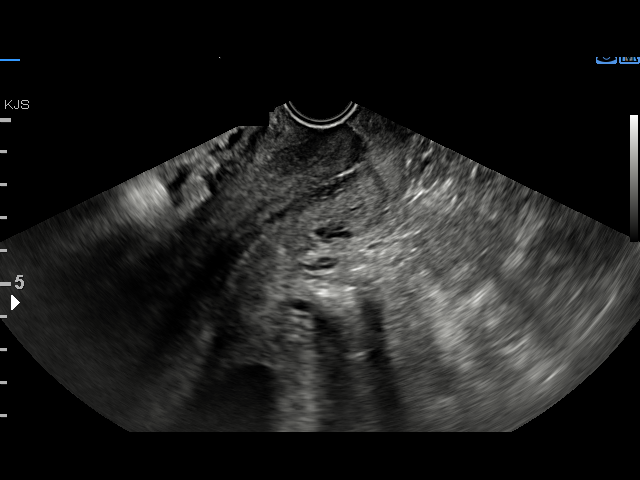
[im 24/26]
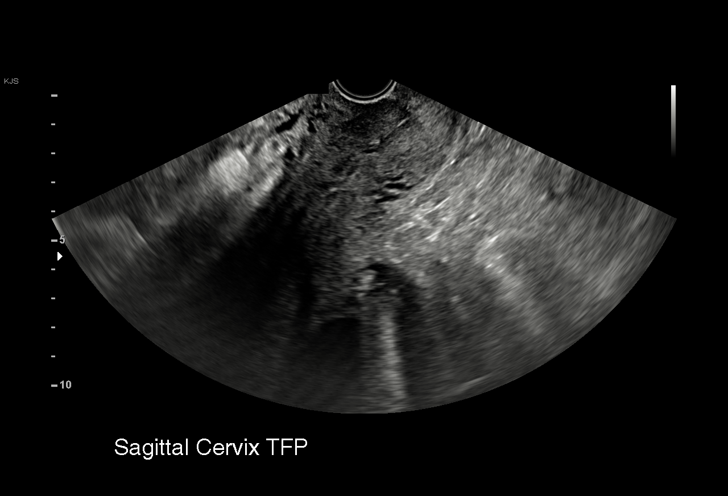
[im 26/26]
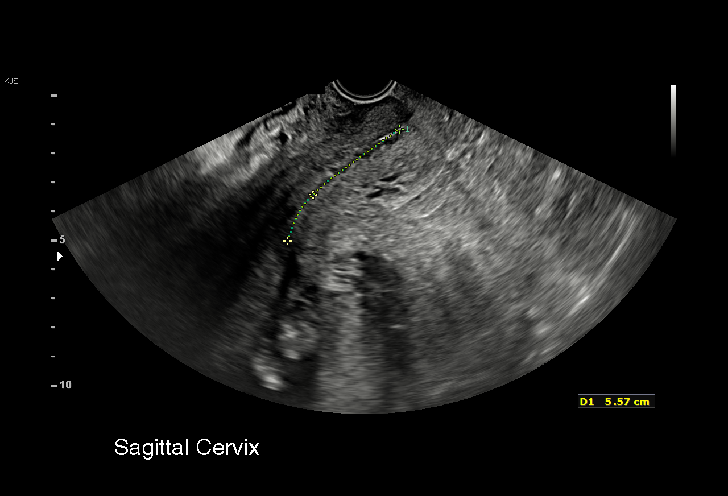

[15 of 26 positions shown; findings below may reference images not displayed]

Obstetrics &
                                                            Gynecology
                                                            [id number]
                                                            [place].

Indications

 Advanced maternal age multigravida 35+,
 second trimester
 Poor obstetric history: Previous midtrimester
 loss (15w, cervical incompetence)
 Encounter for cervical length
 22 weeks gestation of pregnancy
 Declined Panorama
Fetal Evaluation

 Num Of Fetuses:         1
 Fetal Heart Rate(bpm):  158
 Cardiac Activity:       Observed
 Presentation:           Cephalic
 Placenta:               Posterior
 P. Cord Insertion:      Previously Visualized

 Amniotic Fluid
 AFI FV:      Within normal limits

                             Largest Pocket(cm)

Biometry

 LV:        4.7  mm
OB History

 Gravidity:    5         Term:   2
 Living:       2
Gestational Age

 LMP:           20w 6d        Date:  01/16/21                 EDD:   10/23/21
 Best:          22w 5d     Det. By:  U/S C R L  (04/02/21)    EDD:   10/10/21
Anatomy

 Ventricles:            Appears normal         Kidneys:                Appear normal
 Diaphragm:             Appears normal         Bladder:                Appears normal
 Stomach:               Appears normal, left
                        sided
Cervix Uterus Adnexa

 Cervix
 Length:            4.4  cm.
 Normal appearance by transvaginal scan

 Uterus
 No abnormality visualized.
Impression

 History of midtrimester pregnancy loss.  Patient return for
 cervical length measurement.

 A limited ultrasound study was performed.  Amniotic fluid is
 normal good fetal activity seen.  On transvaginal ultrasound,
 the cervix measures 4.4 cm, which is normal.  No shortening
 or funneling was seen on transfundal pressure
Recommendations

 -Patient has a follow-up appointment next week.
 -We will discontinue cervical length measurements
                 Nl, Maria Vela

## 2022-10-30 DIAGNOSIS — F411 Generalized anxiety disorder: Secondary | ICD-10-CM | POA: Diagnosis not present

## 2022-10-30 DIAGNOSIS — F330 Major depressive disorder, recurrent, mild: Secondary | ICD-10-CM | POA: Diagnosis not present

## 2022-11-02 IMAGING — US US MFM OB FOLLOW-UP
1 series · 13 of 28 positions shown · non-contrast
Comparison: none

[Series 1: us mfm ob follow-up · 13 of 110 slices shown]
[im 5/110]
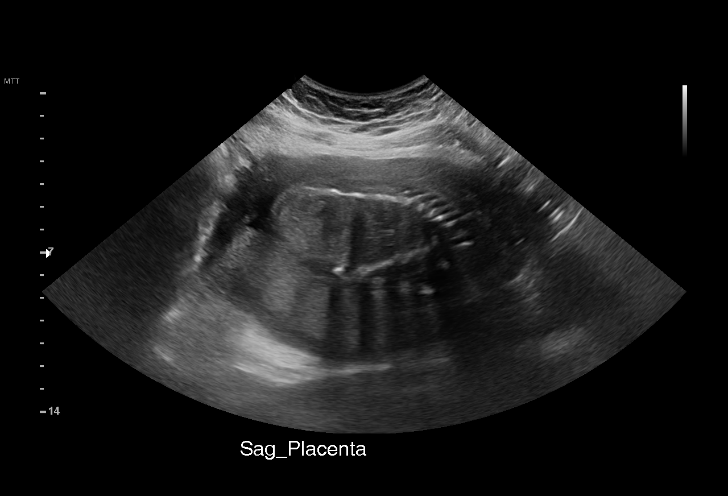
[im 13/110]
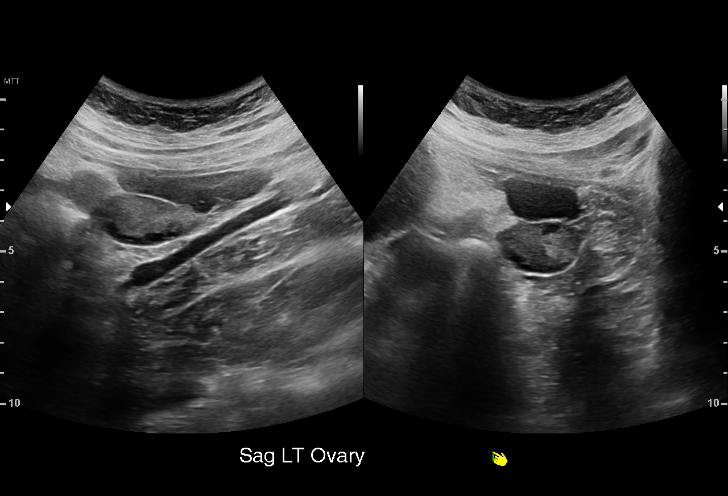
[im 21/110]
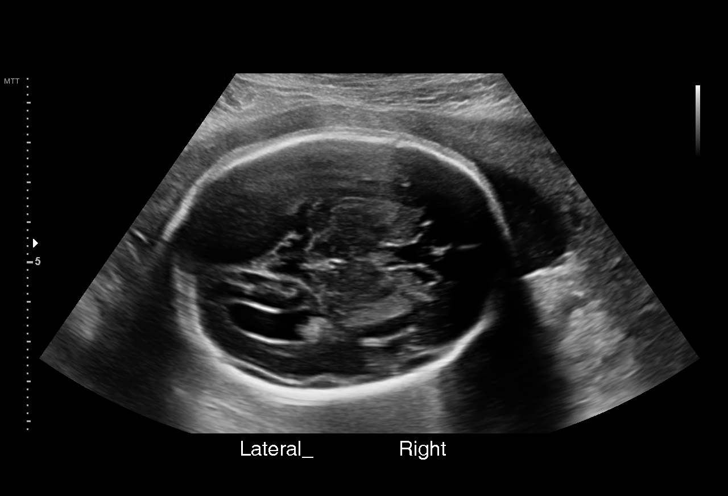
[im 29/110]
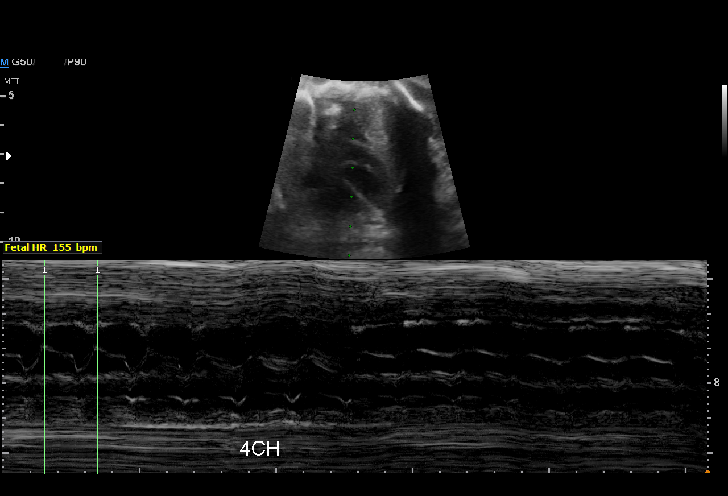
[im 37/110]
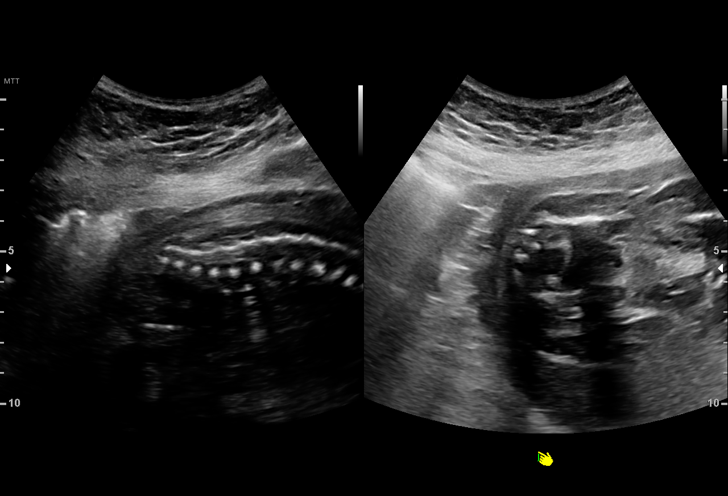
[im 45/110]
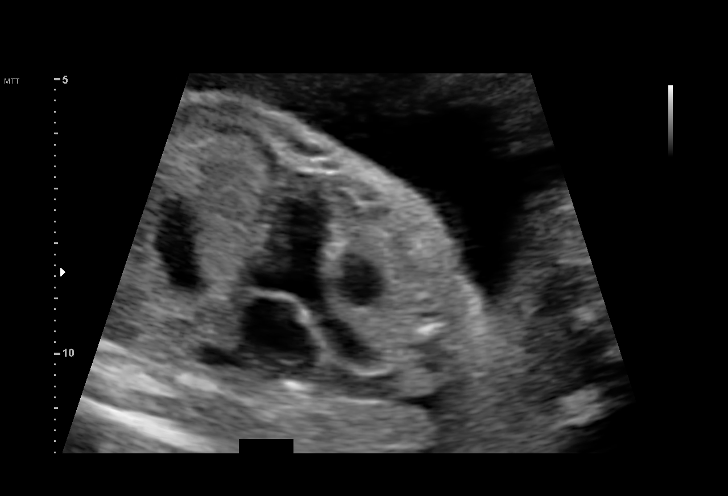
[im 57/110]
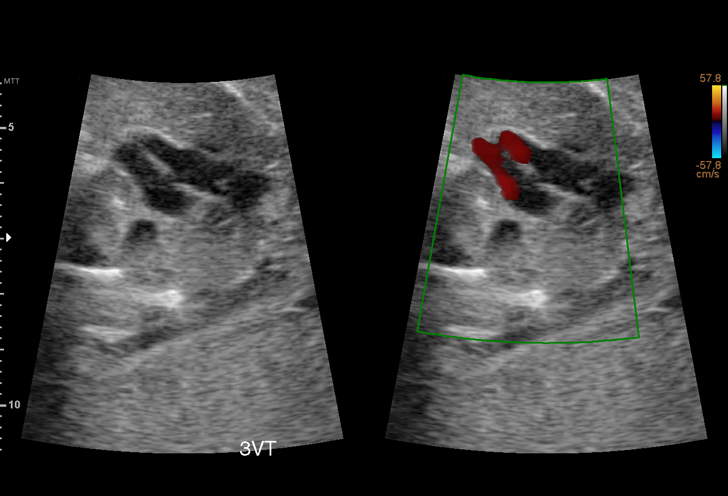
[im 65/110]
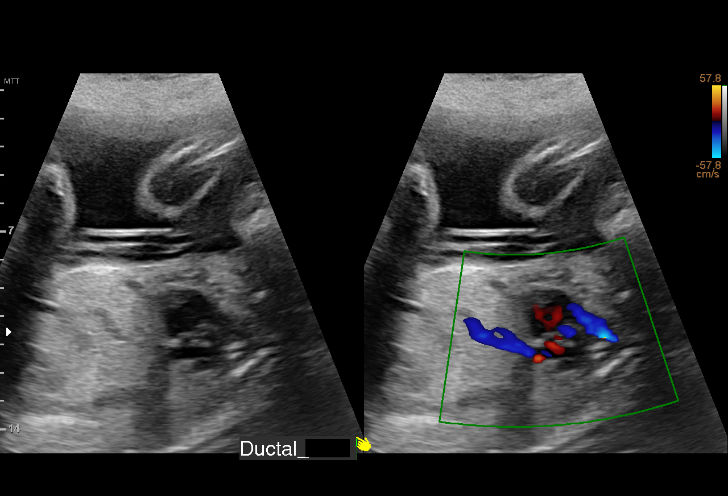
[im 73/110]
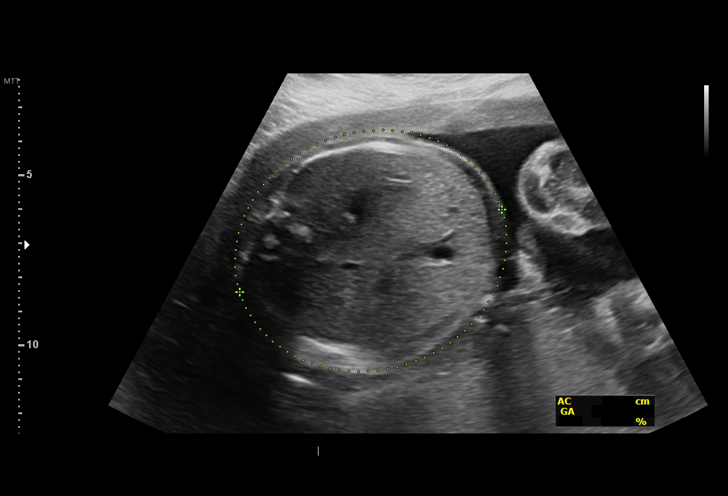
[im 81/110]
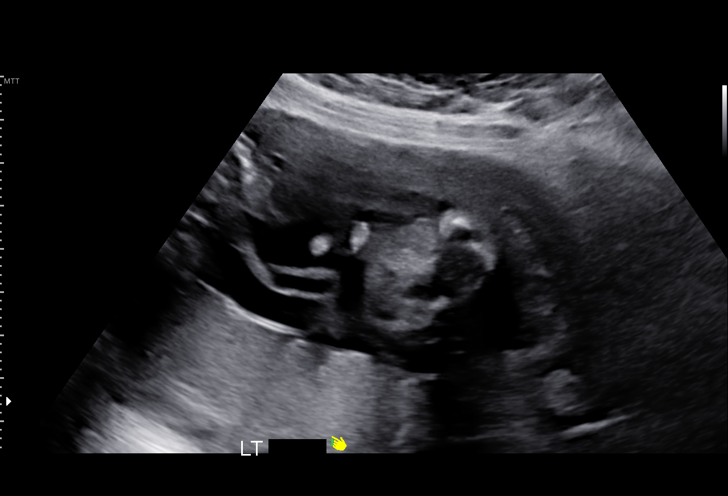
[im 89/110]
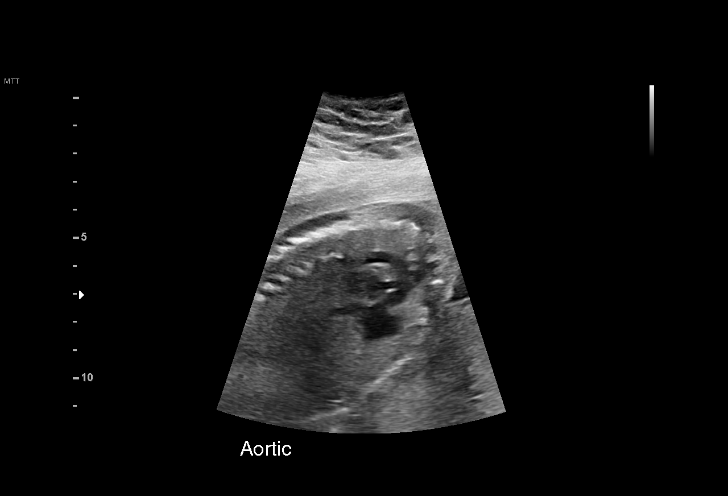
[im 97/110]
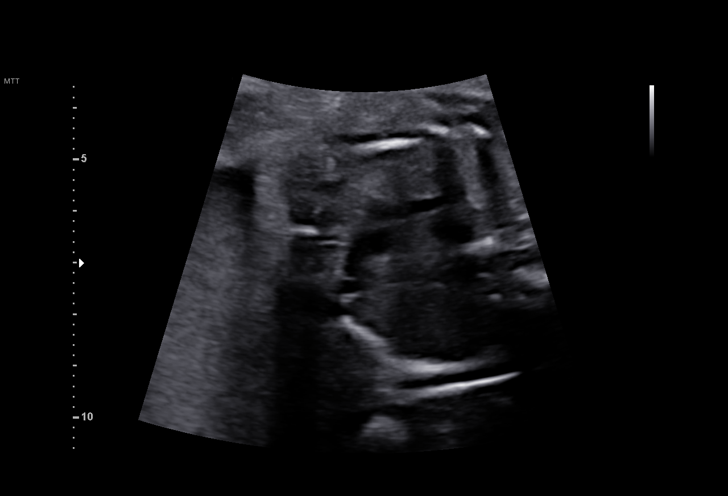
[im 105/110]
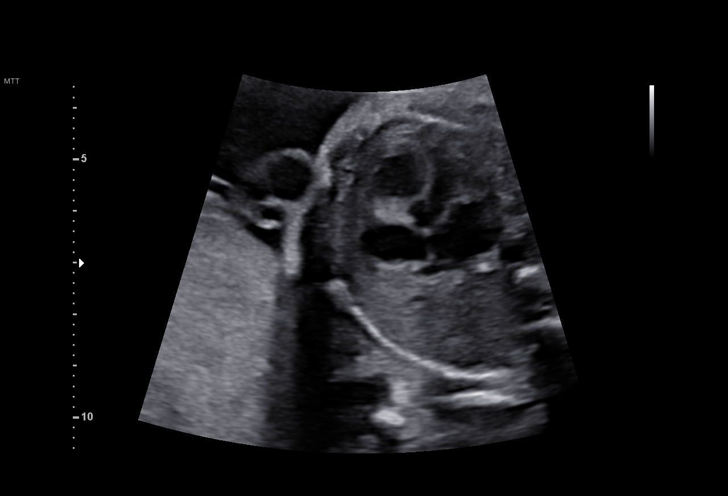

[13 of 28 positions shown; findings below may reference images not displayed]

Obstetrics &
                                                            Gynecology
                                                            [address]
                                                            [place].

Indications

 Advanced maternal age multigravida 35+,
 second trimester
 Poor obstetric history: Previous midtrimester
 loss (15w, cervical incompetence)
 26 weeks gestation of pregnancy
 Encounter for cervical length
 Declined Panorama
Fetal Evaluation

 Num Of Fetuses:         1
 Fetal Heart Rate(bpm):  155
 Cardiac Activity:       Observed
 Presentation:           Cephalic
 Placenta:               Posterior
 P. Cord Insertion:      Previously Visualized

 Amniotic Fluid
 AFI FV:      Within normal limits

                             Largest Pocket(cm)

Biometry

 BPD:        65  mm     G. Age:  26w 2d         24  %    CI:        74.35   %    70 - 86
                                                         FL/HC:      20.6   %    18.6 -
 HC:      239.3  mm     G. Age:  26w 0d          8  %    HC/AC:      1.01        1.05 -
 AC:      236.9  mm     G. Age:  28w 0d         79  %    FL/BPD:     75.7   %    71 - 87
 FL:       49.2  mm     G. Age:  26w 4d         31  %    FL/AC:      20.8   %    20 - 24
 CER:      32.8  mm     G. Age:  28w 0d         90  %

 LV:          8  mm
 CM:        5.1  mm

 Est. FW:    0460  gm      2 lb 5 oz     59  %
OB History

 Gravidity:    5         Term:   2
 Living:       2
Gestational Age

 LMP:           24w 6d        Date:  01/16/21                 EDD:   10/23/21
 U/S Today:     26w 5d                                        EDD:   10/10/21
 Best:          26w 5d     Det. By:  U/S C R L  (04/02/21)    EDD:   10/10/21
Anatomy

 Cranium:               Appears normal         LVOT:                   Appears normal
 Cavum:                 Appears normal         Aortic Arch:            Appears normal
 Ventricles:            Appears normal         Ductal Arch:            Appears normal
 Choroid Plexus:        Previously seen        Diaphragm:              Appears normal
 Cerebellum:            Appears normal         Stomach:                Appears normal, left
                                                                       sided
 Posterior Fossa:       Appears normal         Abdomen:                Previously seen
 Nuchal Fold:           Previously seen        Abdominal Wall:         Previously seen
 Face:                  Appears normal         Cord Vessels:           Previously seen
                        (orbits and profile)
 Lips:                  Appears normal         Kidneys:                Appear normal
 Palate:                Not well visualized    Bladder:                Appears normal
 Thoracic:              Appears normal         Spine:                  Ltd views no
                                                                       intracranial signs of
                                                                       NTD
 Heart:                 Appears normal         Upper Extremities:      Polydactyly
                        (4CH, axis, and
                        situs)
 RVOT:                  Appears normal         Lower Extremities:      Appears normal

 Other:  Fetus appears to be a male. Heels visualized. Right hand not well
         visualized. Technically difficult due to fetal position. VC, 3VV and
         3VTV visualized.
Cervix Uterus Adnexa

 Cervix
 Normal appearance by transvaginal scan

 Uterus
 No abnormality visualized.
 Right Ovary
 Within normal limits.

 Left Ovary
 Within normal limits.

 Cul De Sac
 No free fluid seen.

 Adnexa
 No abnormality visualized.
Comments

 This patient was seen for a follow up growth scan due to
 advanced maternal age and history of a prior previable
 delivery.  She denies any problems since her last exam.
 She was informed that the fetal growth and amniotic fluid
 level appears appropriate for her gestational age.
 Her cervix appears to be closed and was over 3 cm long
 today.
 Polydactyly continues to be suspected in the fetal hands.
 The patient is not concerned regarding this finding.
 A follow up exam was scheduled in 4 weeks.

## 2022-12-26 DIAGNOSIS — N760 Acute vaginitis: Secondary | ICD-10-CM | POA: Diagnosis not present

## 2022-12-26 DIAGNOSIS — Z124 Encounter for screening for malignant neoplasm of cervix: Secondary | ICD-10-CM | POA: Diagnosis not present

## 2022-12-26 DIAGNOSIS — N898 Other specified noninflammatory disorders of vagina: Secondary | ICD-10-CM | POA: Diagnosis not present

## 2022-12-26 DIAGNOSIS — Z3045 Encounter for surveillance of transdermal patch hormonal contraceptive device: Secondary | ICD-10-CM | POA: Diagnosis not present

## 2022-12-26 DIAGNOSIS — Z113 Encounter for screening for infections with a predominantly sexual mode of transmission: Secondary | ICD-10-CM | POA: Diagnosis not present

## 2022-12-26 DIAGNOSIS — Z01411 Encounter for gynecological examination (general) (routine) with abnormal findings: Secondary | ICD-10-CM | POA: Diagnosis not present

## 2022-12-28 IMAGING — US US MFM OB FOLLOW-UP
1 series · 13 of 28 positions shown · non-contrast
Comparison: none

[Series 1: us mfm ob follow-up · 54 acquisitions, 13 frames shown]
[im 2/54]
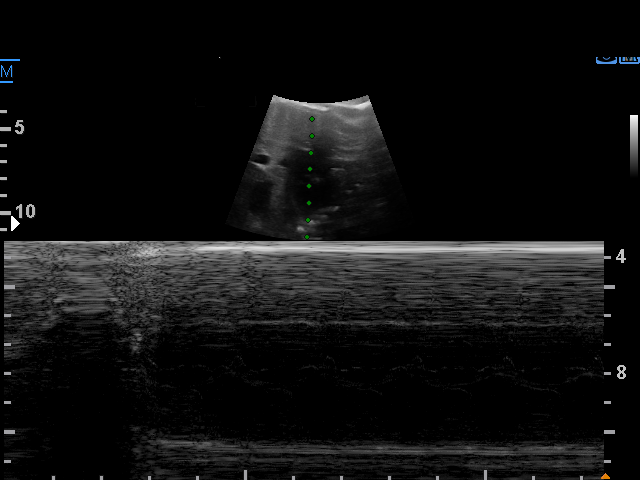
[im 6/54]
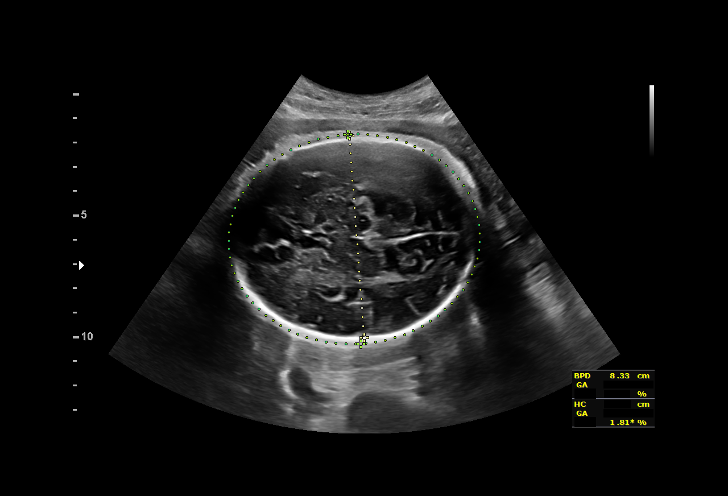
[im 10/54]
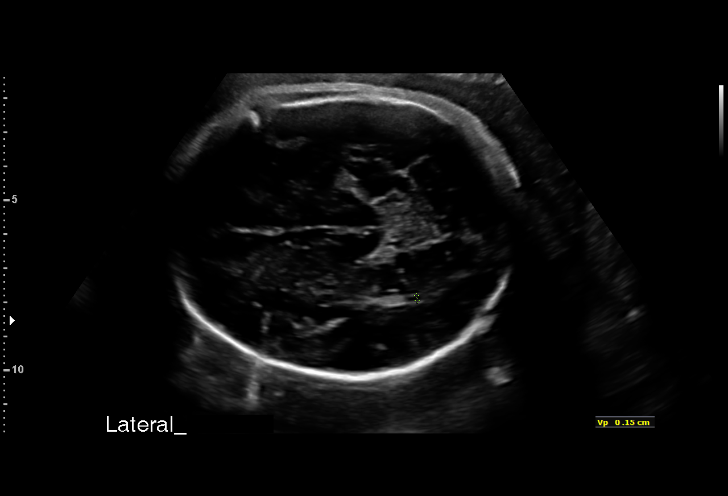
[im 14/54]
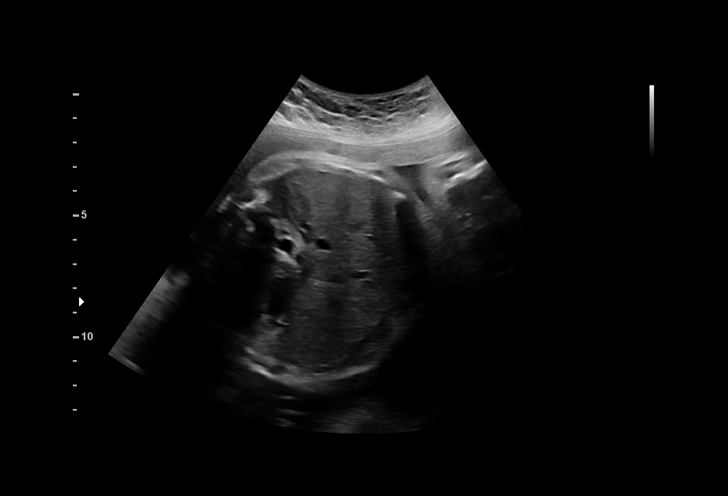
[im 18/54]
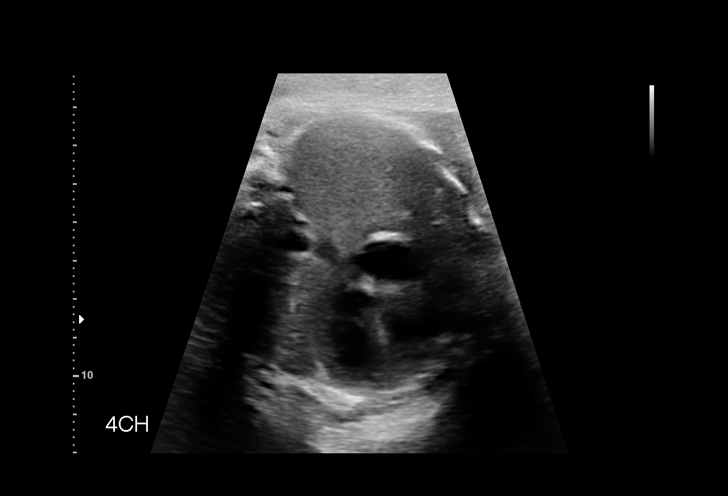
[im 22/54]
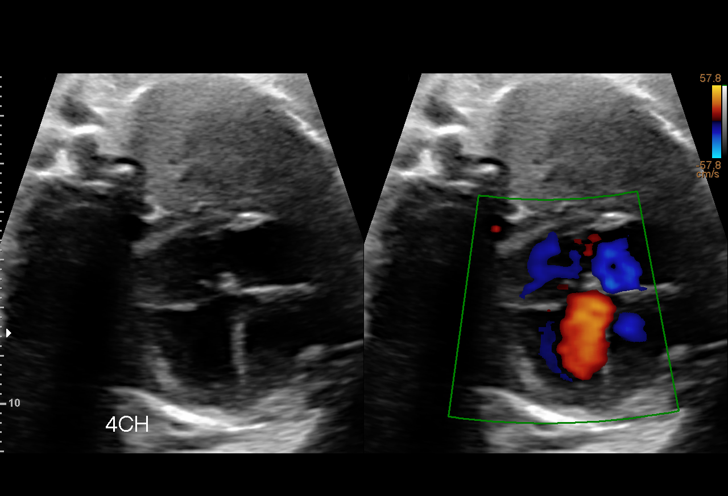
[im 28/54]
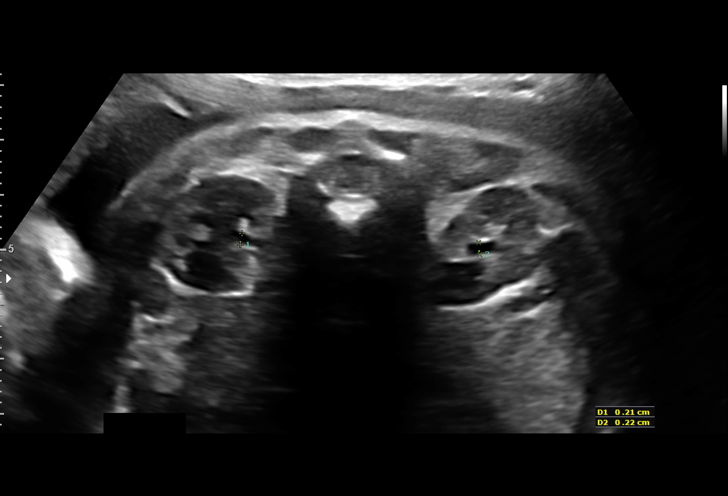
[im 32/54]
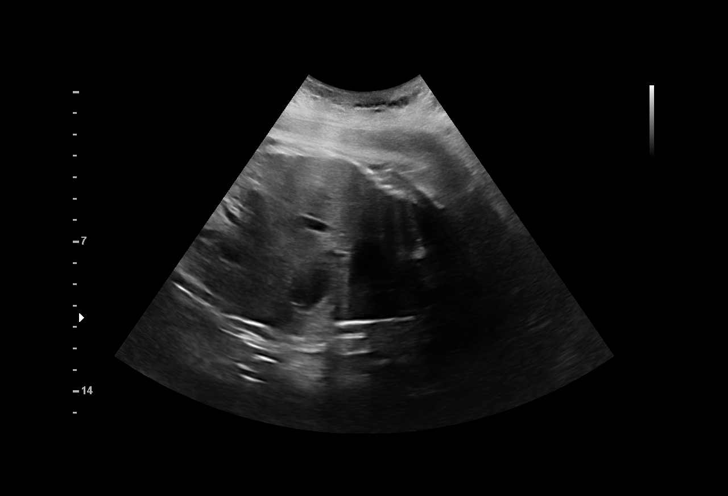
[im 36/54]
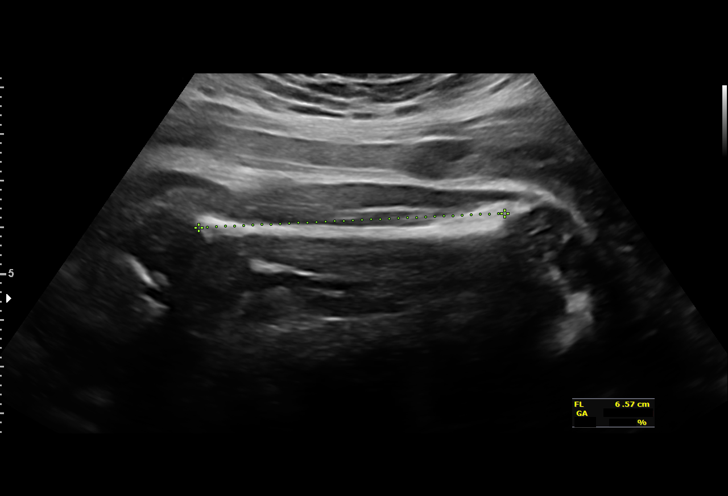
[im 40/54]
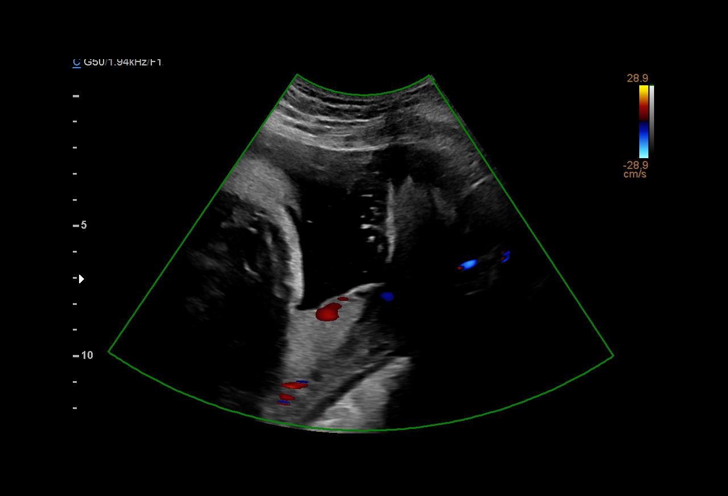
[im 44/54]
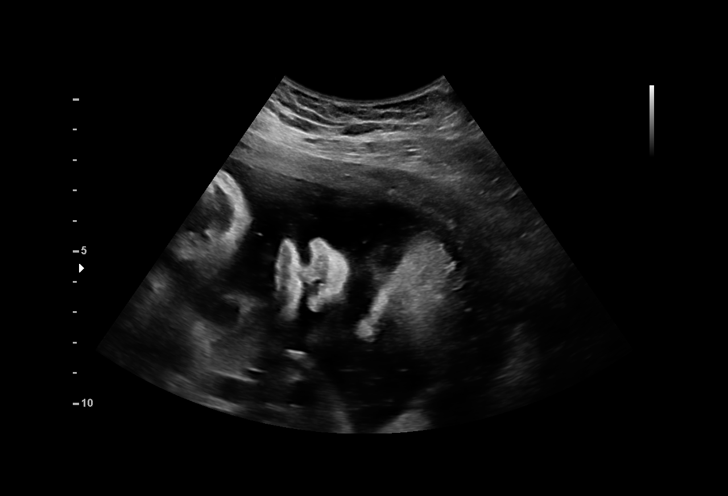
[im 48/54]
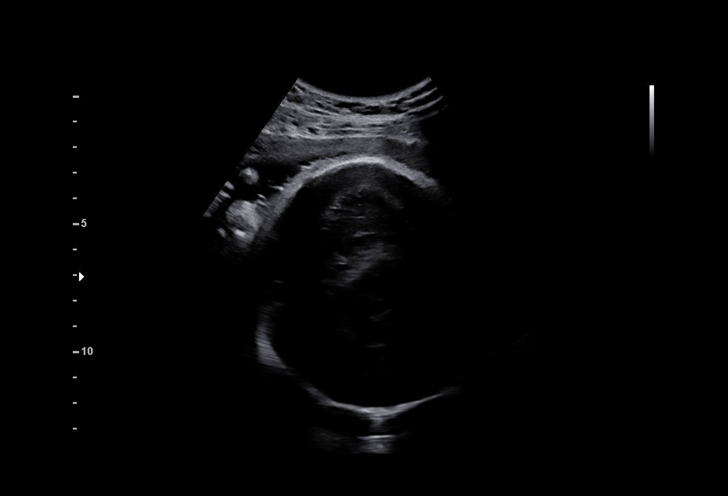
[im 52/54]
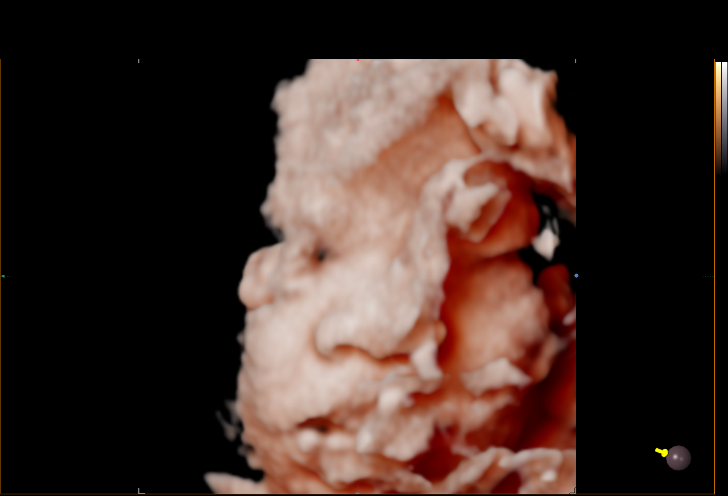

[13 of 28 positions shown; findings below may reference images not displayed]

Obstetrics &
                                                            Gynecology
                                                            [address]
                                                            [place].

Indications

 Advanced maternal age multigravida 35+,
 third trimester
 Poor obstetric history: Previous midtrimester
 loss (15w, cervical incompetence)
 34 weeks gestation of pregnancy
 Declined Panorama
Fetal Evaluation

 Num Of Fetuses:         1
 Fetal Heart Rate(bpm):  147
 Cardiac Activity:       Observed
 Presentation:           Cephalic
 Placenta:               Posterior
 P. Cord Insertion:      Previously Visualized

 Amniotic Fluid
 AFI FV:      Within normal limits

 AFI Sum(cm)     %Tile       Largest Pocket(cm)
 14.44           52

 RUQ(cm)       RLQ(cm)       LUQ(cm)        LLQ(cm)

Biometry

 BPD:      83.8  mm     G. Age:  33w 5d         23  %    CI:        77.83   %    70 - 86
                                                         FL/HC:      22.1   %    20.1 -
 HC:      300.6  mm     G. Age:  33w 2d        2.5  %    HC/AC:      0.99        0.93 -
 AC:      305.1  mm     G. Age:  34w 3d         48  %    FL/BPD:     79.2   %    71 - 87
 FL:       66.4  mm     G. Age:  34w 1d         28  %    FL/AC:      21.8   %    20 - 24
 LV:        1.5  mm

 Est. FW:    1016  gm      5 lb 4 oz     31  %
OB History

 Gravidity:    5         Term:   2
 Living:       2
Gestational Age

 LMP:           32w 6d        Date:  01/16/21                 EDD:   10/23/21
 U/S Today:     33w 6d                                        EDD:   10/16/21
 Best:          34w 5d     Det. By:  U/S C R L  (04/02/21)    EDD:   10/10/21
Anatomy

 Cranium:               Appears normal         LVOT:                   Previously seen
 Cavum:                 Previously seen        Aortic Arch:            Previously seen
 Ventricles:            Appears normal         Ductal Arch:            Previously seen
 Choroid Plexus:        Previously seen        Diaphragm:              Appears normal
 Cerebellum:            Appears normal         Stomach:                Appears normal, left
                                                                       sided
 Posterior Fossa:       Previously seen        Abdomen:                Previously seen
 Nuchal Fold:           Not applicable (>20    Abdominal Wall:         Previously seen
                        wks GA)
 Face:                  Appears normal         Cord Vessels:           Previously seen
                        (orbits and profile)
 Lips:                  Appears normal         Kidneys:                Appear normal
 Palate:                Not well visualized    Bladder:                Appears normal
 Thoracic:              Appears normal         Spine:                  Limited views
                                                                       previously seen
 Heart:                 Appears normal         Upper Extremities:      Polydactyly prev
                        (4CH, axis, and
                        situs)
                                                                       seen

 RVOT:                  Previously seen        Lower Extremities:      Previously seen

 Other:  Fetus appears to be a male. Heels visualized. Right hand not well
         visualized. Technically difficult due to fetal position. VC, 3VV and
         3VTV visualized. previously
Comments

 This patient was seen for a follow up growth scan due to
 advanced maternal age.  She denies any problems since her
 last exam.
 She was informed that the fetal growth and amniotic fluid
 level appears appropriate for her gestational age.
 As the fetal growth is within normal limits, no further exams
 were scheduled in our office.
 She was advised that her baby will have to be examined after
 birth to determine if polydactyly is present.

## 2023-10-23 DIAGNOSIS — N898 Other specified noninflammatory disorders of vagina: Secondary | ICD-10-CM | POA: Diagnosis not present

## 2023-10-23 DIAGNOSIS — Z113 Encounter for screening for infections with a predominantly sexual mode of transmission: Secondary | ICD-10-CM | POA: Diagnosis not present

## 2023-10-23 DIAGNOSIS — R350 Frequency of micturition: Secondary | ICD-10-CM | POA: Diagnosis not present

## 2024-01-13 DIAGNOSIS — Z01419 Encounter for gynecological examination (general) (routine) without abnormal findings: Secondary | ICD-10-CM | POA: Diagnosis not present

## 2024-01-13 DIAGNOSIS — Z3009 Encounter for other general counseling and advice on contraception: Secondary | ICD-10-CM | POA: Diagnosis not present

## 2024-01-13 DIAGNOSIS — Z113 Encounter for screening for infections with a predominantly sexual mode of transmission: Secondary | ICD-10-CM | POA: Diagnosis not present

## 2024-01-27 ENCOUNTER — Inpatient Hospital Stay (HOSPITAL_BASED_OUTPATIENT_CLINIC_OR_DEPARTMENT_OTHER)
Admission: AD | Admit: 2024-01-27 | Discharge: 2024-01-30 | DRG: 770 | Disposition: A | Discharge status: Home / Self Care | Attending: Obstetrics and Gynecology | Admitting: Obstetrics and Gynecology

## 2024-01-27 ENCOUNTER — Other Ambulatory Visit: Payer: Self-pay

## 2024-01-27 ENCOUNTER — Encounter (HOSPITAL_COMMUNITY): Payer: Self-pay | Admitting: Obstetrics and Gynecology

## 2024-01-27 ENCOUNTER — Emergency Department (HOSPITAL_BASED_OUTPATIENT_CLINIC_OR_DEPARTMENT_OTHER)

## 2024-01-27 DIAGNOSIS — O0338 Urinary tract infection following incomplete spontaneous abortion: Secondary | ICD-10-CM | POA: Diagnosis present

## 2024-01-27 DIAGNOSIS — O034 Incomplete spontaneous abortion without complication: Secondary | ICD-10-CM

## 2024-01-27 DIAGNOSIS — N939 Abnormal uterine and vaginal bleeding, unspecified: Principal | ICD-10-CM

## 2024-01-27 DIAGNOSIS — Z3A00 Weeks of gestation of pregnancy not specified: Secondary | ICD-10-CM | POA: Diagnosis not present

## 2024-01-27 DIAGNOSIS — D500 Iron deficiency anemia secondary to blood loss (chronic): Secondary | ICD-10-CM | POA: Diagnosis present

## 2024-01-27 DIAGNOSIS — O0337 Sepsis following incomplete spontaneous abortion: Principal | ICD-10-CM | POA: Diagnosis present

## 2024-01-27 DIAGNOSIS — O021 Missed abortion: Secondary | ICD-10-CM | POA: Diagnosis not present

## 2024-01-27 DIAGNOSIS — N390 Urinary tract infection, site not specified: Secondary | ICD-10-CM

## 2024-01-27 LAB — CBC WITH DIFFERENTIAL/PLATELET
Abs Immature Granulocytes: 0.04 K/uL (ref 0.00–0.07)
Basophils Absolute: 0 K/uL (ref 0.0–0.1)
Basophils Relative: 0 %
Eosinophils Absolute: 0.1 K/uL (ref 0.0–0.5)
Eosinophils Relative: 1 %
HCT: 34.4 % — ABNORMAL LOW (ref 36.0–46.0)
Hemoglobin: 10.6 g/dL — ABNORMAL LOW (ref 12.0–15.0)
Immature Granulocytes: 1 %
Lymphocytes Relative: 10 %
Lymphs Abs: 0.7 K/uL (ref 0.7–4.0)
MCH: 20.9 pg — ABNORMAL LOW (ref 26.0–34.0)
MCHC: 30.8 g/dL (ref 30.0–36.0)
MCV: 68 fL — ABNORMAL LOW (ref 80.0–100.0)
Monocytes Absolute: 0.5 K/uL (ref 0.1–1.0)
Monocytes Relative: 6 %
Neutro Abs: 5.8 K/uL (ref 1.7–7.7)
Neutrophils Relative %: 82 %
Platelets: 228 K/uL (ref 150–400)
RBC: 5.06 MIL/uL (ref 3.87–5.11)
RDW: 16.8 % — ABNORMAL HIGH (ref 11.5–15.5)
WBC: 7.1 K/uL (ref 4.0–10.5)
nRBC: 0 % (ref 0.0–0.2)

## 2024-01-27 LAB — URINALYSIS, ROUTINE W REFLEX MICROSCOPIC
Bilirubin Urine: NEGATIVE
Glucose, UA: NEGATIVE mg/dL
Ketones, ur: NEGATIVE mg/dL
Nitrite: NEGATIVE
Specific Gravity, Urine: 1.031 — ABNORMAL HIGH (ref 1.005–1.030)
pH: 5.5 (ref 5.0–8.0)

## 2024-01-27 LAB — TYPE AND SCREEN
ABO/RH(D): A POS
Antibody Screen: NEGATIVE

## 2024-01-27 LAB — PREGNANCY, URINE: Preg Test, Ur: NEGATIVE

## 2024-01-27 LAB — LACTIC ACID, PLASMA: Lactic Acid, Venous: 0.6 mmol/L (ref 0.5–1.9)

## 2024-01-27 MED ORDER — LACTATED RINGERS IV SOLN: INTRAVENOUS | Status: AC

## 2024-01-27 MED ORDER — ONDANSETRON HCL 4 MG PO TABS: 4.0000 mg | ORAL_TABLET | Freq: Four times a day (QID) | ORAL | Status: DC | PRN

## 2024-01-27 MED ORDER — SENNOSIDES-DOCUSATE SODIUM 8.6-50 MG PO TABS: 1.0000 | ORAL_TABLET | Freq: Every evening | ORAL | Status: DC | PRN

## 2024-01-27 MED ORDER — ACETAMINOPHEN 325 MG PO TABS
650.0000 mg | ORAL_TABLET | ORAL | Status: DC | PRN
  Administered 2024-01-28 – 2024-01-30 (×2): 650 mg via ORAL
  Filled 2024-01-27 (×2): qty 2

## 2024-01-27 MED ORDER — CEPHALEXIN 500 MG PO CAPS: 500.0000 mg | ORAL_CAPSULE | Freq: Two times a day (BID) | ORAL | 0 refills | Status: AC

## 2024-01-27 MED ORDER — DOXYCYCLINE HYCLATE 100 MG PO TABS
100.0000 mg | ORAL_TABLET | Freq: Two times a day (BID) | ORAL | Status: DC
  Administered 2024-01-27: 100 mg via ORAL
  Filled 2024-01-27: qty 1

## 2024-01-27 MED ORDER — CEPHALEXIN 250 MG PO CAPS
500.0000 mg | ORAL_CAPSULE | Freq: Once | ORAL | Status: AC
  Administered 2024-01-27: 500 mg via ORAL
  Filled 2024-01-27: qty 2

## 2024-01-27 MED ORDER — PRENATAL MULTIVITAMIN CH: 1.0000 | ORAL_TABLET | Freq: Every day | ORAL | Status: DC

## 2024-01-27 MED ORDER — OXYCODONE-ACETAMINOPHEN 5-325 MG PO TABS: 1.0000 | ORAL_TABLET | ORAL | Status: DC | PRN

## 2024-01-27 MED ORDER — ACETAMINOPHEN 500 MG PO TABS
1000.0000 mg | ORAL_TABLET | Freq: Once | ORAL | Status: AC
  Administered 2024-01-27: 1000 mg via ORAL
  Filled 2024-01-27: qty 2

## 2024-01-27 MED ORDER — ONDANSETRON HCL 4 MG/2ML IJ SOLN: 4.0000 mg | Freq: Four times a day (QID) | INTRAMUSCULAR | Status: DC | PRN

## 2024-01-27 MED ORDER — ZOLPIDEM TARTRATE 5 MG PO TABS: 5.0000 mg | ORAL_TABLET | Freq: Every evening | ORAL | Status: DC | PRN

## 2024-01-27 MED ORDER — METRONIDAZOLE 500 MG PO TABS
500.0000 mg | ORAL_TABLET | Freq: Two times a day (BID) | ORAL | Status: DC
  Administered 2024-01-27: 500 mg via ORAL
  Filled 2024-01-27: qty 1

## 2024-01-27 NOTE — ED Notes (Signed)
 Extra light green and 2 extra gold tops sent to lab to hold.

## 2024-01-27 NOTE — H&P (Signed)
 Bridget Summers is an 38 y.o. female. Tansferred from [facility] with questionalble retained products and fever.  Pt [facility] and has had bleeding ever since the second week of August.  She presented to the ER with fever and pain  Pertinent Gynecological History: Menses: flow is moderate Bleeding: intermenstrual bleeding Contraception: none DES exposure: denies Blood transfusions: none Sexually transmitted diseases: no past history Previous GYN Procedures: CS  Last mammogram: NA Date:  Last pap: normal Date:  OB History: G5, P3   Menstrual History: Menarche age: 15 No LMP recorded.    Past Medical History:  Diagnosis Date   Chlamydia    Ovarian cyst    Pregnancy induced hypertension     Past Surgical History:  Procedure Laterality Date   CESAREAN SECTION N/A 10/08/2021   Procedure: CESAREAN SECTION;  Surgeon: Bridget Chick, MD;  Location: [facility] LD ORS;  Service: Obstetrics;  Laterality: N/A;   INDUCED ABORTION     NO PAST SURGERIES      Family History  Problem Relation Age of Onset   Diabetes Paternal Grandfather    Stroke Paternal Grandfather    Healthy Mother    Lupus Father    Heart disease Maternal Grandmother    Hypertension Maternal Grandmother     Social History:  reports that she has never smoked. She has never used smokeless tobacco. She reports that she does not currently use alcohol. She reports that she does not use drugs.  Allergies: No Known Allergies  Medications Prior to Admission  Medication Sig Dispense Refill Last Dose/Taking   acetaminophen  (TYLENOL ) 500 MG tablet Take 2 tablets (1,000 mg total) by mouth every 6 (six) hours. 30 tablet 0 01/27/2024   Docosahexaenoic Acid (DHA OMEGA 3 PO) Take 1 capsule by mouth daily.   01/26/2024   Oregano-Flaxseed Oil (OREGANO OIL IMMUNE SUPPORT PO) Take 1 capsule by mouth daily.   01/26/2024   gabapentin  (NEURONTIN ) 100 MG capsule Take 1 capsule (100 mg total) by mouth every 8 (eight)  hours for 5 days. 15 capsule 0    ibuprofen  (ADVIL ) 600 MG tablet Take 1 tablet (600 mg total) by mouth every 6 (six) hours. 30 tablet 0    iron  polysaccharides (NIFEREX) 150 MG capsule Take 1 capsule (150 mg total) by mouth daily. 30 capsule 2    Prenatal Vit-Fe Fumarate-FA (PRENATAL MULTIVITAMIN) TABS tablet Take 1 tablet by mouth daily at 12 noon. 30 tablet 3     Review of Systems  Blood pressure (!) 147/97, pulse (!) 107, temperature 98.2 F (36.8 C), temperature source Oral, resp. rate 18, height 5' 5 (1.651 m), weight 110 kg, SpO2 97%, unknown if currently breastfeeding. Physical Exam Physical Examination: General appearance - alert, well appearing, and in no distress Chest - clear to auscultation, no wheezes, rales or rhonchi, symmetric air entry Heart - normal rate and regular rhythm Abdomen - soft, nontender, nondistended, no masses or organomegaly Pelvic - normal external genitalia, vulva, vagina, cervix, uterus and adnexa, MILD vb Extremities - peripheral pulses normal, no pedal edema, no clubbing or cyanosis, Homan's sign negative bilaterally   Results for orders placed or performed during the hospital encounter of 01/27/24 (from the past 24 hours)  CBC with Differential/Platelet     Status: Abnormal   Collection Time: 01/27/24 11:10 AM  Result Value Ref Range   WBC 7.1 4.0 - 10.5 K/uL   RBC 5.06 3.87 - 5.11 MIL/uL   Hemoglobin 10.6 (L) 12.0 - 15.0  g/dL   HCT 65.5 (L) 63.9 - 53.9 %   MCV 68.0 (L) 80.0 - 100.0 fL   MCH 20.9 (L) 26.0 - 34.0 pg   MCHC 30.8 30.0 - 36.0 g/dL   RDW 83.1 (H) 88.4 - 84.4 %   Platelets 228 150 - 400 K/uL   nRBC 0.0 0.0 - 0.2 %   Neutrophils Relative % 82 %   Neutro Abs 5.8 1.7 - 7.7 K/uL   Lymphocytes Relative 10 %   Lymphs Abs 0.7 0.7 - 4.0 K/uL   Monocytes Relative 6 %   Monocytes Absolute 0.5 0.1 - 1.0 K/uL   Eosinophils Relative 1 %   Eosinophils Absolute 0.1 0.0 - 0.5 K/uL   Basophils Relative 0 %   Basophils Absolute 0.0 0.0 - 0.1  K/uL   Immature Granulocytes 1 %   Abs Immature Granulocytes 0.04 0.00 - 0.07 K/uL  Urinalysis, Routine w reflex microscopic -Urine, Clean Catch     Status: Abnormal   Collection Time: 01/27/24 11:10 AM  Result Value Ref Range   Color, Urine YELLOW YELLOW   APPearance CLEAR CLEAR   Specific Gravity, Urine 1.031 (H) 1.005 - 1.030   pH 5.5 5.0 - 8.0   Glucose, UA NEGATIVE NEGATIVE mg/dL   Hgb urine dipstick LARGE (A) NEGATIVE   Bilirubin Urine NEGATIVE NEGATIVE   Ketones, ur NEGATIVE NEGATIVE mg/dL   Protein, ur TRACE (A) NEGATIVE mg/dL   Nitrite NEGATIVE NEGATIVE   Leukocytes,Ua MODERATE (A) NEGATIVE   RBC / HPF 6-10 0 - 5 RBC/hpf   WBC, UA 6-10 0 - 5 WBC/hpf   Bacteria, UA RARE (A) NONE SEEN   Squamous Epithelial / HPF 0-5 0 - 5 /HPF   Mucus PRESENT    Hyaline Casts, UA PRESENT   Pregnancy, urine     Status: None   Collection Time: 01/27/24 11:10 AM  Result Value Ref Range   Preg Test, Ur NEGATIVE NEGATIVE  Lactic acid, plasma     Status: None   Collection Time: 01/27/24  4:51 PM  Result Value Ref Range   Lactic Acid, Venous 0.6 0.5 - 1.9 mmol/L    US  PELVIC COMPLETE WITH TRANSVAGINAL Result Date: 01/27/2024 CLINICAL DATA:  Vaginal bleeding EXAM: TRANSABDOMINAL AND TRANSVAGINAL ULTRASOUND OF PELVIS TECHNIQUE: Both transabdominal and transvaginal ultrasound examinations of the pelvis were performed. Transabdominal technique was performed for global imaging of the pelvis including uterus, ovaries, adnexal regions, and pelvic cul-de-sac. It was necessary to proceed with endovaginal exam following the transabdominal exam to visualize the uterus and ovaries. COMPARISON:  None Available. FINDINGS: Uterus Measurements: 9.5 x 5.1 x 5.9 cm = volume: 150 mL. Heterogeneous myometrium without discrete measurable fibroid. Endometrium Endometrial echo complex is heterogeneous and thickened measuring up to 14.2 mm containing cystic foci. No significant increased vascularity within the endometrial  canal. Right ovary Measurements: 2.8 x 2.3 x 1.9 cm = volume: 6.4 mL. Nonspecific punctate calcification measuring 4 mm. Otherwise normal appearance/no adnexal mass. Left ovary Measurements: 4 x 3.3 x 2.4 cm = volume: 15.7 mL. Multiple peripheral located follicles and a complex cystic lesion measuring 2.1 cm. There is increased vascularity of the solid component on color Doppler images. Other findings No abnormal free fluid. IMPRESSION: Heterogeneous endometrial echo complex with fluid component/cystic changes without significant increased vascularity. Findings may correlate to current patient's bleeding status/blood products however underlying endometrial lesions can not be excluded. Recommend pelvic MRI with contrast for further characterization. Left ovary complex cystic structure with some increased vascularity.  This can be further assessed at the time of MRI. Heterogeneous myometrium. Electronically Signed   By: Megan  Zare M.D.   On: 01/27/2024 14:36    Assessment/Plan: S/P MEDICAL AB  NOW WITH FEVER AND BLOOD CLOT VS RETAINED POC PT DESIRES OBS WILL START ABX IVF REPEAT US  IN AM WILL ONLY O D&e IF C/W RETAINED POC RECHECK CBC IN AM WILL HOLD ON METHERGINE PT WITH INCREASE BP  Rodolfo Gaster A Marlicia Sroka 01/27/2024, 7:49 PM

## 2024-01-27 NOTE — MAU Note (Addendum)
 Bridget Summers is a 38 y.o. at Unknown here in MAU reporting: attempted medication abortion the first week of August. States took the first medicine at the clinic ([facility]) and the second one at home the next day. States has not stopped bleeding since. Reports she hasn't bled through a pad since last week, but she changes her pad every 31min-1hr because it's uncomfortable. States she would have come last week but she thought it was her period. She does report passing tissue in the immediate days post medication. Reports moderate cramping in lower abdomen more on right side. Went to [facility] ED today for UTI symptoms and informed the provider there of these issues so they decided she needed to be seen here.    Pain score: 4/10 Vitals:   01/27/24 1532 01/27/24 1823  BP:  (!) 147/97  Pulse:  (!) 107  Resp:  18  Temp: (!) 101 F (38.3 C) 99.5 F (37.5 C)  SpO2:  97%

## 2024-01-27 NOTE — ED Triage Notes (Signed)
 Patient states took abortion pill at the beginning of August. States bleeding since with pelvic pain.

## 2024-01-27 NOTE — ED Provider Notes (Signed)
 [place] EMERGENCY DEPARTMENT AT [facility] Provider Note   CSN: [id number] Arrival date & time: 01/27/24  1052     Patient presents with: Vaginal Bleeding   Bridget Summers is a 38 y.o. female presents today for vaginal bleeding and mild pelvic pain.  Patient reports taking medication to induce abortion the first week of August and vaginal bleeding since.  Patient reports feeling a overnight pad every 3-5 hours last week, this week this has become lighter.  Patient also reporting urinary urgency and frequency.  Patient denies dysuria, hematuria, fever, chills, nausea, or vomiting.  Patient reports having STD testing approximately 2 weeks ago which was negative and single sexual partner.    Vaginal Bleeding      Prior to Admission medications   Medication Sig Start Date End Date Taking? Authorizing Provider  cephALEXin  (KEFLEX ) 500 MG capsule Take 1 capsule (500 mg total) by mouth 2 (two) times daily. 01/27/24  Yes Harsh Trulock N, PA-C  acetaminophen  (TYLENOL ) 500 MG tablet Take 2 tablets (1,000 mg total) by mouth every 6 (six) hours. 10/11/21   Grice, Vivian B, CNM  gabapentin  (NEURONTIN ) 100 MG capsule Take 1 capsule (100 mg total) by mouth every 8 (eight) hours for 5 days. 10/11/21 10/16/21  Grice, Vivian B, CNM  ibuprofen  (ADVIL ) 600 MG tablet Take 1 tablet (600 mg total) by mouth every 6 (six) hours. 10/11/21   Grice, Vivian B, CNM  iron  polysaccharides (NIFEREX) 150 MG capsule Take 1 capsule (150 mg total) by mouth daily. 09/03/18   Armond Cape, MD  Prenatal Vit-Fe Fumarate-FA (PRENATAL MULTIVITAMIN) TABS tablet Take 1 tablet by mouth daily at 12 noon. 09/04/18   Dillard, Naima, MD    Allergies: Patient has no known allergies.    Review of Systems  Genitourinary:  Positive for frequency, pelvic pain, urgency and vaginal bleeding.    Updated Vital Signs BP (!) 149/91   Pulse 91   Temp (!) 101 F (38.3 C) (Rectal)   Resp 16   SpO2 100%   Physical Exam Vitals  and nursing note reviewed.  Constitutional:      General: She is not in acute distress.    Appearance: Normal appearance. She is well-developed. She is not toxic-appearing or diaphoretic.  HENT:     Head: Normocephalic and atraumatic.     Right Ear: External ear normal.     Left Ear: External ear normal.     Nose: Nose normal.  Eyes:     Extraocular Movements: Extraocular movements intact.     Conjunctiva/sclera: Conjunctivae normal.  Cardiovascular:     Rate and Rhythm: Normal rate and regular rhythm.     Pulses: Normal pulses.     Heart sounds: Normal heart sounds. No murmur heard. Pulmonary:     Effort: Pulmonary effort is normal. No respiratory distress.     Breath sounds: Normal breath sounds.  Abdominal:     Palpations: Abdomen is soft.     Tenderness: There is no abdominal tenderness.  Genitourinary:    Comments: Deferred by patient Musculoskeletal:        General: No swelling.     Cervical back: Neck supple.  Skin:    General: Skin is warm and dry.     Capillary Refill: Capillary refill takes less than 2 seconds.  Neurological:     General: No focal deficit present.     Mental Status: She is alert and oriented to person, place, and time.  Psychiatric:  Mood and Affect: Mood normal.     (all labs ordered are listed, but only abnormal results are displayed) Labs Reviewed  CBC WITH DIFFERENTIAL/PLATELET - Abnormal; Notable for the following components:      Result Value   Hemoglobin 10.6 (*)    HCT 34.4 (*)    MCV 68.0 (*)    MCH 20.9 (*)    RDW 16.8 (*)    All other components within normal limits  URINALYSIS, ROUTINE W REFLEX MICROSCOPIC - Abnormal; Notable for the following components:   Specific Gravity, Urine 1.031 (*)    Hgb urine dipstick LARGE (*)    Protein, ur TRACE (*)    Leukocytes,Ua MODERATE (*)    Bacteria, UA RARE (*)    All other components within normal limits  URINE CULTURE  CULTURE, BLOOD (ROUTINE X 2)  CULTURE, BLOOD (ROUTINE X  2)  PREGNANCY, URINE  LACTIC ACID, PLASMA  LACTIC ACID, PLASMA    EKG: None  Radiology: US  PELVIC COMPLETE WITH TRANSVAGINAL Result Date: 01/27/2024 CLINICAL DATA:  Vaginal bleeding EXAM: TRANSABDOMINAL AND TRANSVAGINAL ULTRASOUND OF PELVIS TECHNIQUE: Both transabdominal and transvaginal ultrasound examinations of the pelvis were performed. Transabdominal technique was performed for global imaging of the pelvis including uterus, ovaries, adnexal regions, and pelvic cul-de-sac. It was necessary to proceed with endovaginal exam following the transabdominal exam to visualize the uterus and ovaries. COMPARISON:  None Available. FINDINGS: Uterus Measurements: 9.5 x 5.1 x 5.9 cm = volume: 150 mL. Heterogeneous myometrium without discrete measurable fibroid. Endometrium Endometrial echo complex is heterogeneous and thickened measuring up to 14.2 mm containing cystic foci. No significant increased vascularity within the endometrial canal. Right ovary Measurements: 2.8 x 2.3 x 1.9 cm = volume: 6.4 mL. Nonspecific punctate calcification measuring 4 mm. Otherwise normal appearance/no adnexal mass. Left ovary Measurements: 4 x 3.3 x 2.4 cm = volume: 15.7 mL. Multiple peripheral located follicles and a complex cystic lesion measuring 2.1 cm. There is increased vascularity of the solid component on color Doppler images. Other findings No abnormal free fluid. IMPRESSION: Heterogeneous endometrial echo complex with fluid component/cystic changes without significant increased vascularity. Findings may correlate to current patient's bleeding status/blood products however underlying endometrial lesions can not be excluded. Recommend pelvic MRI with contrast for further characterization. Left ovary complex cystic structure with some increased vascularity. This can be further assessed at the time of MRI. Heterogeneous myometrium. Electronically Signed   By: Megan  Zare M.D.   On: 01/27/2024 14:36     Procedures    Medications Ordered in the ED  cephALEXin  (KEFLEX ) capsule 500 mg (500 mg Oral Given 01/27/24 1357)                                    Medical Decision Making Amount and/or Complexity of Data Reviewed Labs: ordered. Radiology: ordered.   This patient presents to the ED for concern of vaginal bleeding differential diagnosis includes retained products of conception, abnormal uterine bleeding, UTI, pyelonephritis, ectopic pregnancy, routine pregnancy    Additional history obtained   Additional history obtained from Electronic Medical Record External records from outside source obtained and reviewed including Care Everywhere   Lab Tests:  I Ordered, and personally interpreted labs.  The pertinent results include: Anemia at 10.6 which is improved from baseline of around 8-9, negative pregnancy, UA with large hemoglobin, moderate leukocytes, trace protein, elevated specific gravity, rare bacteria, 6-10 RBCs, 6-10 WBCs  Imaging Studies ordered:  I ordered imaging studies including TVUS and ultrasound pelvic I independently visualized and interpreted imaging which showed heterogenous endometrial echocomplex with fluid component/cystic changes without significant increased vascularity.  Recommend pelvic MRI with contrast for further characterization.  Left ovarian complex cystic structure with some increased vascularity.  This can be further assessed at time of MRI.  Heterogenous myometrium. I agree with the radiologist interpretation   Problem List / ED Course:  Patient given Keflex  while in the emergency department given urinary symptoms and urine suspicious for infection Consulted OB/GYN, Dr. Armond who recommended transfer by POV to MAU for further evaluation and treatment.     Final diagnoses:  Vaginal bleeding  Urinary tract infection with hematuria, site unspecified    ED Discharge Orders          Ordered    cephALEXin  (KEFLEX ) 500 MG capsule  2 times daily         01/27/24 1555               Francis Ileana SAILOR, PA-C 01/27/24 1601    Jerrol Agent, MD 01/28/24 1017

## 2024-01-28 ENCOUNTER — Inpatient Hospital Stay (HOSPITAL_COMMUNITY)

## 2024-01-28 ENCOUNTER — Other Ambulatory Visit: Payer: Self-pay | Admitting: Obstetrics and Gynecology

## 2024-01-28 ENCOUNTER — Encounter (HOSPITAL_COMMUNITY): Payer: Self-pay | Admitting: Obstetrics and Gynecology

## 2024-01-28 LAB — CBC WITH DIFFERENTIAL/PLATELET
Abs Immature Granulocytes: 0.01 K/uL (ref 0.00–0.07)
Basophils Absolute: 0 K/uL (ref 0.0–0.1)
Basophils Relative: 1 %
Eosinophils Absolute: 0.1 K/uL (ref 0.0–0.5)
Eosinophils Relative: 1 %
HCT: 36.5 % (ref 36.0–46.0)
Hemoglobin: 11.2 g/dL — ABNORMAL LOW (ref 12.0–15.0)
Immature Granulocytes: 0 %
Lymphocytes Relative: 16 %
Lymphs Abs: 1.1 K/uL (ref 0.7–4.0)
MCH: 21.3 pg — ABNORMAL LOW (ref 26.0–34.0)
MCHC: 30.7 g/dL (ref 30.0–36.0)
MCV: 69.3 fL — ABNORMAL LOW (ref 80.0–100.0)
Monocytes Absolute: 0.5 K/uL (ref 0.1–1.0)
Monocytes Relative: 8 %
Neutro Abs: 5.1 K/uL (ref 1.7–7.7)
Neutrophils Relative %: 74 %
Platelets: 238 K/uL (ref 150–400)
RBC: 5.27 MIL/uL — ABNORMAL HIGH (ref 3.87–5.11)
RDW: 16.3 % — ABNORMAL HIGH (ref 11.5–15.5)
WBC: 6.8 K/uL (ref 4.0–10.5)
nRBC: 0 % (ref 0.0–0.2)

## 2024-01-28 LAB — URINE CULTURE: Culture: NO GROWTH

## 2024-01-28 MED ORDER — SODIUM CHLORIDE 0.9 % IV SOLN
100.0000 mg | Freq: Two times a day (BID) | INTRAVENOUS | Status: DC
  Administered 2024-01-28 – 2024-01-30 (×5): 100 mg via INTRAVENOUS
  Filled 2024-01-28 (×6): qty 100

## 2024-01-28 MED ORDER — METRONIDAZOLE 500 MG/100ML IV SOLN
500.0000 mg | Freq: Two times a day (BID) | INTRAVENOUS | Status: DC
  Administered 2024-01-28 – 2024-01-30 (×5): 500 mg via INTRAVENOUS
  Filled 2024-01-28 (×5): qty 100

## 2024-01-28 NOTE — Progress Notes (Signed)
 Subjective: Patient reports Feeling better.    Objective: I have reviewed patient's vital signs, labs, and radiology results. Tm 101.9 At 0349  Tn 99.3  General: alert, cooperative, and no distress Resp: Unlabored breathing Cardio: regular rate and rhythm Extremities: No calf tenderness, neg edema Vaginal Bleeding: minimal Abdomen soft, NT with deep palpation   Assessment/Plan: HD #2 s/p medical an who presented with pain and fever.  Pt has been a febrile since last night But low grade of 99.3 this afternoon.   Ultrasound reviewed with blood products vs retained POCs. Options reviewed and discussed if it is retained POCs, the infection may recur.  Currently she is on doxycycline  and flagyl  and improving.  Recommend cont abx for at least 10 days. Pt decided to do D&C and is scheduled for tomorrow with Dr. Armond. NPO after MN  LOS: 1 day    Jon CINDERELLA Rummer, MD 01/28/2024, 3:21 PM

## 2024-01-29 ENCOUNTER — Inpatient Hospital Stay (HOSPITAL_COMMUNITY): Payer: Self-pay | Admitting: Anesthesiology

## 2024-01-29 ENCOUNTER — Encounter (HOSPITAL_COMMUNITY)
Admission: AD | Disposition: A | Discharge status: Home / Self Care | Payer: Self-pay | Source: Home / Self Care | Attending: Obstetrics and Gynecology

## 2024-01-29 ENCOUNTER — Other Ambulatory Visit: Payer: Self-pay

## 2024-01-29 ENCOUNTER — Ambulatory Visit: Admit: 2024-01-29 | Admitting: Obstetrics and Gynecology

## 2024-01-29 ENCOUNTER — Inpatient Hospital Stay (HOSPITAL_COMMUNITY)

## 2024-01-29 ENCOUNTER — Encounter (HOSPITAL_COMMUNITY): Payer: Self-pay | Admitting: Obstetrics and Gynecology

## 2024-01-29 DIAGNOSIS — Z3A00 Weeks of gestation of pregnancy not specified: Secondary | ICD-10-CM

## 2024-01-29 DIAGNOSIS — O021 Missed abortion: Secondary | ICD-10-CM

## 2024-01-29 HISTORY — PX: OPERATIVE ULTRASOUND: SHX5996

## 2024-01-29 HISTORY — PX: DILATION AND EVACUATION: SHX1459

## 2024-01-29 LAB — BASIC METABOLIC PANEL WITH GFR
Anion gap: 11 (ref 5–15)
BUN: 6 mg/dL (ref 6–20)
CO2: 22 mmol/L (ref 22–32)
Calcium: 8.5 mg/dL — ABNORMAL LOW (ref 8.9–10.3)
Chloride: 107 mmol/L (ref 98–111)
Creatinine, Ser: 0.87 mg/dL (ref 0.44–1.00)
GFR, Estimated: >60 mL/min (ref >60)
Glucose, Bld: 99 mg/dL (ref 70–99)
Potassium: 4.2 mmol/L (ref 3.5–5.1)
Sodium: 140 mmol/L (ref 135–145)

## 2024-01-29 LAB — HCG, QUANTITATIVE, PREGNANCY: hCG, Beta Chain, Quant, S: 3 m[IU]/mL (ref <5)

## 2024-01-29 SURGERY — DILATION AND EVACUATION, UTERUS
Anesthesia: General

## 2024-01-29 MED ORDER — FENTANYL CITRATE (PF) 250 MCG/5ML IJ SOLN
INTRAMUSCULAR | Status: AC
  Filled 2024-01-29: qty 5

## 2024-01-29 MED ORDER — FENTANYL CITRATE (PF) 250 MCG/5ML IJ SOLN
INTRAMUSCULAR | Status: DC | PRN
  Administered 2024-01-29 (×3): 50 ug via INTRAVENOUS

## 2024-01-29 MED ORDER — ACETAMINOPHEN 500 MG PO TABS
1000.0000 mg | ORAL_TABLET | Freq: Once | ORAL | Status: AC
Start: 2024-01-29 — End: 2024-01-29

## 2024-01-29 MED ORDER — DEXAMETHASONE SODIUM PHOSPHATE 10 MG/ML IJ SOLN
INTRAMUSCULAR | Status: DC | PRN
  Administered 2024-01-29: 10 mg via INTRAVENOUS

## 2024-01-29 MED ORDER — PROPOFOL 10 MG/ML IV BOLUS
INTRAVENOUS | Status: DC | PRN
Start: 2024-01-29 — End: 2024-01-29
  Administered 2024-01-29 (×3): 50 mg via INTRAVENOUS
  Administered 2024-01-29: 200 mg via INTRAVENOUS

## 2024-01-29 MED ORDER — SUCCINYLCHOLINE CHLORIDE 200 MG/10ML IV SOSY
PREFILLED_SYRINGE | INTRAVENOUS | Status: DC | PRN
  Administered 2024-01-29: 120 mg via INTRAVENOUS

## 2024-01-29 MED ORDER — MIDAZOLAM HCL 2 MG/2ML IJ SOLN
INTRAMUSCULAR | Status: DC | PRN
  Administered 2024-01-29: 2 mg via INTRAVENOUS

## 2024-01-29 MED ORDER — LIDOCAINE HCL 2 % IJ SOLN
INTRAMUSCULAR | Status: DC | PRN
  Administered 2024-01-29: 9 mL

## 2024-01-29 MED ORDER — MIDAZOLAM HCL 2 MG/2ML IJ SOLN
INTRAMUSCULAR | Status: AC
  Filled 2024-01-29: qty 2

## 2024-01-29 MED ORDER — LACTATED RINGERS IV SOLN: INTRAVENOUS | Status: DC

## 2024-01-29 MED ORDER — 0.9 % SODIUM CHLORIDE (POUR BTL) OPTIME
TOPICAL | Status: DC | PRN
  Administered 2024-01-29: 1000 mL

## 2024-01-29 MED ORDER — CELECOXIB 200 MG PO CAPS
ORAL_CAPSULE | ORAL | Status: AC
  Administered 2024-01-29: 200 mg via ORAL
  Filled 2024-01-29: qty 1

## 2024-01-29 MED ORDER — PHENYLEPHRINE 80 MCG/ML (10ML) SYRINGE FOR IV PUSH (FOR BLOOD PRESSURE SUPPORT)
PREFILLED_SYRINGE | INTRAVENOUS | Status: DC | PRN
  Administered 2024-01-29 (×2): 160 ug via INTRAVENOUS
  Administered 2024-01-29: 80 ug via INTRAVENOUS

## 2024-01-29 MED ORDER — ONDANSETRON HCL 4 MG/2ML IJ SOLN
INTRAMUSCULAR | Status: DC | PRN
  Administered 2024-01-29: 4 mg via INTRAVENOUS

## 2024-01-29 MED ORDER — EPHEDRINE 5 MG/ML INJ
INTRAVENOUS | Status: AC
Start: 2024-01-29 — End: 2024-01-29
  Filled 2024-01-29: qty 5

## 2024-01-29 MED ORDER — CHLORHEXIDINE GLUCONATE 0.12 % MT SOLN
15.0000 mL | Freq: Once | OROMUCOSAL | Status: AC
  Administered 2024-01-29: 15 mL via OROMUCOSAL
  Filled 2024-01-29: qty 15

## 2024-01-29 MED ORDER — EPHEDRINE SULFATE-NACL 50-0.9 MG/10ML-% IV SOSY
PREFILLED_SYRINGE | INTRAVENOUS | Status: DC | PRN
Start: 2024-01-29 — End: 2024-01-29
  Administered 2024-01-29: 10 mg via INTRAVENOUS

## 2024-01-29 MED ORDER — MENTHOL 3 MG MT LOZG
1.0000 | LOZENGE | OROMUCOSAL | Status: DC | PRN
  Filled 2024-01-29: qty 9

## 2024-01-29 MED ORDER — LIDOCAINE HCL 2 % IJ SOLN
INTRAMUSCULAR | Status: AC
  Filled 2024-01-29: qty 20

## 2024-01-29 MED ORDER — CELECOXIB 200 MG PO CAPS: 200.0000 mg | ORAL_CAPSULE | Freq: Once | ORAL | Status: AC

## 2024-01-29 MED ORDER — NIFEDIPINE ER OSMOTIC RELEASE 30 MG PO TB24
30.0000 mg | ORAL_TABLET | Freq: Once | ORAL | Status: AC
  Administered 2024-01-29: 30 mg via ORAL
  Filled 2024-01-29: qty 1

## 2024-01-29 MED ORDER — NIFEDIPINE ER OSMOTIC RELEASE 30 MG PO TB24
30.0000 mg | ORAL_TABLET | Freq: Every day | ORAL | Status: DC
  Administered 2024-01-30: 30 mg via ORAL
  Filled 2024-01-29: qty 1

## 2024-01-29 MED ORDER — SUCCINYLCHOLINE CHLORIDE 200 MG/10ML IV SOSY
PREFILLED_SYRINGE | INTRAVENOUS | Status: AC
  Filled 2024-01-29: qty 10

## 2024-01-29 MED ORDER — PHENYLEPHRINE 80 MCG/ML (10ML) SYRINGE FOR IV PUSH (FOR BLOOD PRESSURE SUPPORT)
PREFILLED_SYRINGE | INTRAVENOUS | Status: AC
Start: 2024-01-29 — End: 2024-01-29
  Filled 2024-01-29: qty 10

## 2024-01-29 MED ORDER — LIDOCAINE 2% (20 MG/ML) 5 ML SYRINGE
INTRAMUSCULAR | Status: DC | PRN
  Administered 2024-01-29: 100 mg via INTRAVENOUS

## 2024-01-29 MED ORDER — PROPOFOL 10 MG/ML IV BOLUS
INTRAVENOUS | Status: AC
  Filled 2024-01-29: qty 20

## 2024-01-29 MED ORDER — ACETAMINOPHEN 500 MG PO TABS
ORAL_TABLET | ORAL | Status: AC
  Administered 2024-01-29: 1000 mg via ORAL
  Filled 2024-01-29: qty 2

## 2024-01-29 MED ORDER — CHLORHEXIDINE GLUCONATE 0.12 % MT SOLN
OROMUCOSAL | Status: AC
  Filled 2024-01-29: qty 15

## 2024-01-29 MED ORDER — ORAL CARE MOUTH RINSE: 15.0000 mL | Freq: Once | OROMUCOSAL | Status: AC

## 2024-01-29 SURGICAL SUPPLY — 23 items
CATH ROBINSON RED A/P 16FR (CATHETERS) ×1 IMPLANT
COVER MAYO STAND STRL (DRAPES) ×1 IMPLANT
DILATOR CANAL MILEX (MISCELLANEOUS) IMPLANT
FILTER UTR ASPR ASSEMBLY (MISCELLANEOUS) ×1 IMPLANT
GLOVE BIO SURGEON STRL SZ 6.5 (GLOVE) ×1 IMPLANT
GLOVE BIOGEL PI IND STRL 7.0 (GLOVE) ×2 IMPLANT
GOWN STRL REUS W/ TWL LRG LVL3 (GOWN DISPOSABLE) ×2 IMPLANT
HOSE CONNECTING 18IN BERKELEY (TUBING) ×1 IMPLANT
KIT BERKELEY 1ST TRI 3/8 NO TR (MISCELLANEOUS) ×1 IMPLANT
KIT BERKELEY 1ST TRIMESTER 3/8 (MISCELLANEOUS) ×1 IMPLANT
LEGGING LITHOTOMY PAIR STRL (DRAPES) IMPLANT
NS IRRIG 1000ML POUR BTL (IV SOLUTION) ×1 IMPLANT
PACK VAGINAL MINOR WOMEN LF (CUSTOM PROCEDURE TRAY) ×1 IMPLANT
PAD OB MATERNITY 11 LF (PERSONAL CARE ITEMS) ×1 IMPLANT
SET BERKELEY SUCTION TUBING (SUCTIONS) ×1 IMPLANT
SPIKE FLUID TRANSFER (MISCELLANEOUS) ×1 IMPLANT
SYR 20ML LL LF (SYRINGE) ×1 IMPLANT
TOWEL GREEN STERILE FF (TOWEL DISPOSABLE) ×2 IMPLANT
UNDERPAD 30X36 HEAVY ABSORB (UNDERPADS AND DIAPERS) ×1 IMPLANT
VACURETTE 10 RIGID CVD (CANNULA) IMPLANT
VACURETTE 7MM CVD STRL WRAP (CANNULA) IMPLANT
VACURETTE 8 RIGID CVD (CANNULA) IMPLANT
VACURETTE 9 RIGID CVD (CANNULA) IMPLANT

## 2024-01-29 NOTE — Anesthesia Procedure Notes (Signed)
 Procedure Name: LMA Insertion Date/Time: 01/29/2024 3:43 PM  Performed by: Harrold Macintosh, CRNAPre-anesthesia Checklist: Patient identified, Emergency Drugs available, Suction available, Patient being monitored and Timeout performed Patient Re-evaluated:Patient Re-evaluated prior to induction Oxygen Delivery Method: Circle system utilized Preoxygenation: Pre-oxygenation with 100% oxygen Induction Type: IV induction LMA: LMA inserted LMA Size: 4.0 Number of attempts: 1 Placement Confirmation: positive ETCO2 Tube secured with: Tape Dental Injury: Teeth and Oropharynx as per pre-operative assessment

## 2024-01-29 NOTE — Progress Notes (Signed)
 Date of Initial H&P: 01/27/24  History reviewed, patient examined, no change in status, stable for surgery.

## 2024-01-29 NOTE — Anesthesia Preprocedure Evaluation (Addendum)
 Anesthesia Evaluation  Patient identified by MRN, date of birth, ID band Patient awake    Reviewed: Allergy & Precautions, H&P , NPO status , Patient's Chart, lab work & pertinent test results  Airway Mallampati: I  TM Distance: >3 FB Neck ROM: Full    Dental no notable dental hx. (+) Teeth Intact, Dental Advisory Given   Pulmonary neg pulmonary ROS   Pulmonary exam normal breath sounds clear to auscultation       Cardiovascular Exercise Tolerance: Good hypertension, negative cardio ROS Normal cardiovascular exam Rhythm:Regular Rate:Normal     Neuro/Psych negative neurological ROS  negative psych ROS   GI/Hepatic negative GI ROS, Neg liver ROS,,,  Endo/Other  negative endocrine ROS    Renal/GU negative Renal ROS  negative genitourinary   Musculoskeletal negative musculoskeletal ROS (+)    Abdominal   Peds negative pediatric ROS (+)  Hematology negative hematology ROS (+) Blood dyscrasia, anemia   Anesthesia Other Findings   Reproductive/Obstetrics negative OB ROS                              Anesthesia Physical Anesthesia Plan  ASA: 2  Anesthesia Plan: General   Post-op Pain Management: Tylenol  PO (pre-op)* and Celebrex  PO (pre-op)*   Induction: Intravenous  PONV Risk Score and Plan: 3 and Ondansetron  and Dexamethasone   Airway Management Planned: Oral ETT and LMA  Additional Equipment: None  Intra-op Plan:   Post-operative Plan: Extubation in OR  Informed Consent: I have reviewed the patients History and Physical, chart, labs and discussed the procedure including the risks, benefits and alternatives for the proposed anesthesia with the patient or authorized representative who has indicated his/her understanding and acceptance.       Plan Discussed with: Anesthesiologist and CRNA  Anesthesia Plan Comments: (  )         Anesthesia Quick Evaluation

## 2024-01-29 NOTE — Op Note (Signed)
 Pre op DX :  septic incomplete abortion Postoperative Diagnosis: same Procedure:  dilation and evacuation Anesthesia:  MAC and local Surgeon:  Dr. Armond Asst : none Complications : none Procedure in detail: The patient was taken to the operating room where she was given MAC anesthesia. She was placed in dorsal lithotomy position and prepped and draped in normal sterile fashion. . This was examined and noted to have a 7 week size uterus with no adnexal masses. A bivalve was placed into the vagina. A tenaculum was placed on the cervix. The cervix was infiltrated with 10 cc 2% lidocaine  paracervical block. The cervix then dilated with dilators up to 21.  A size 7 suction curettage was placed into the uterine cavity. A scant amount of products of conception was seen. The suction curettage was removed when a gritty texture was noted. A sharp curette was done along all walls  of the uterus. The suction curet was placed back into the uterine cavity. No further products of conception were obtained. Sponge lap and needle counts were correct. Patient back in stable condition. The patient understood to be the risks to be, but not  limited to,  Bleeding,  Infection,  damage to internal organs by perforation of the uterus and Asherman syndrome (scarring in the uterus) leading to infertility.   Pt does not have a ride and will be discharged tomorrow

## 2024-01-29 NOTE — Anesthesia Procedure Notes (Addendum)
 Procedure Name: Intubation Date/Time: 01/29/2024 3:54 PM  Performed by: Erma Thom SAUNDERS, MDPre-anesthesia Checklist: Patient identified, Emergency Drugs available, Suction available and Patient being monitored Patient Re-evaluated:Patient Re-evaluated prior to induction Oxygen Delivery Method: Circle system utilized Preoxygenation: Pre-oxygenation with 100% oxygen Induction Type: IV induction Ventilation: Mask ventilation without difficulty Laryngoscope Size: Miller and 2 Grade View: Grade II Tube type: Oral Tube size: 7.0 mm Number of attempts: 1 Airway Equipment and Method: Stylet and Oral airway Placement Confirmation: ETT inserted through vocal cords under direct vision, positive ETCO2 and breath sounds checked- equal and bilateral Secured at: 23 cm Tube secured with: Tape Dental Injury: Teeth and Oropharynx as per pre-operative assessment

## 2024-01-29 NOTE — Transfer of Care (Signed)
 Immediate Anesthesia Transfer of Care Note  Patient: Bridget Summers  Procedure(s) Performed: DILATION AND EVACUATION, UTERUS US  INTRAOPERATIVE  Patient Location: PACU  Anesthesia Type:General  Level of Consciousness: awake, alert , and oriented  Airway & Oxygen Therapy: Patient Spontanous Breathing and Patient connected to face mask oxygen  Post-op Assessment: Report given to RN, Post -op Vital signs reviewed and stable, Patient moving all extremities X 4, and Patient able to stick tongue midline  Post vital signs: Reviewed and stable  Last Vitals:  Vitals Value Taken Time  BP 144/90 01/29/24 16:37  Temp 37.1 C 01/29/24 16:37  Pulse 102 01/29/24 16:38  Resp 26 01/29/24 16:38  SpO2 95 % 01/29/24 16:38  Vitals shown include unfiled device data.  Last Pain:  Vitals:   01/29/24 1426  TempSrc:   PainSc: 0-No pain         Complications: No notable events documented.

## 2024-01-29 NOTE — Progress Notes (Signed)
 Subjective: Pt without complaint  Objective: BP (!) 142/79 (BP Location: Left Arm)   Pulse 69   Temp 98.2 F (36.8 C) (Oral)   Resp 16   Ht 5' 5 (1.651 m)   Wt 110 kg   SpO2 100%   BMI 40.36 kg/m   I have reviewed patient's vital signs, intake and output, medications, labs, microbiology, pathology and radiology results.  General: alert Resp: clear to auscultation bilaterally Cardio: regular rate and rhythm, S1, S2 normal, no murmur, click, rub or gallop GI: soft, non-tender; bowel sounds normal; no masses,  no organomegaly Extremities: extremities normal, atraumatic, no cyanosis or edema Vaginal Bleeding: minimal   Assessment/Plan: Septic Ab pt afebrile for 24 hours.  Plan D&C and home with abx R&B reviewed with the pt  LOS: 2 days    Xareni Kelch A Norton Bivins 01/29/2024, 9:44 AM

## 2024-01-30 ENCOUNTER — Encounter (HOSPITAL_COMMUNITY): Payer: Self-pay | Admitting: Obstetrics and Gynecology

## 2024-01-30 MED ORDER — IBUPROFEN 600 MG PO TABS
600.0000 mg | ORAL_TABLET | Freq: Four times a day (QID) | ORAL | Status: DC
  Administered 2024-01-30: 600 mg via ORAL
  Filled 2024-01-30: qty 1

## 2024-01-30 MED ORDER — IBUPROFEN 600 MG PO TABS: 600.0000 mg | ORAL_TABLET | Freq: Four times a day (QID) | ORAL | 0 refills | Status: AC

## 2024-01-30 MED ORDER — MEDROXYPROGESTERONE ACETATE 150 MG/ML IM SUSP
150.0000 mg | Freq: Once | INTRAMUSCULAR | Status: AC
  Administered 2024-01-30: 150 mg via INTRAMUSCULAR
  Filled 2024-01-30: qty 1

## 2024-01-30 MED ORDER — ETONOGESTREL-ETHINYL ESTRADIOL 0.12-0.015 MG/24HR VA RING: 1.0000 | VAGINAL_RING | VAGINAL | 5 refills | Status: AC

## 2024-01-30 MED ORDER — NIFEDIPINE ER 30 MG PO TB24: 30.0000 mg | ORAL_TABLET | Freq: Every day | ORAL | 0 refills | Status: AC

## 2024-01-30 NOTE — Anesthesia Postprocedure Evaluation (Signed)
 Anesthesia Post Note  Patient: Bridget Summers  Procedure(s) Performed: DILATION AND EVACUATION, UTERUS US  INTRAOPERATIVE     Patient location during evaluation: PACU Anesthesia Type: General Level of consciousness: awake and alert Pain management: pain level controlled Vital Signs Assessment: post-procedure vital signs reviewed and stable Respiratory status: spontaneous breathing, nonlabored ventilation, respiratory function stable and patient connected to nasal cannula oxygen Cardiovascular status: blood pressure returned to baseline and stable Postop Assessment: no apparent nausea or vomiting Anesthetic complications: no   No notable events documented.  Last Vitals:  Vitals:   01/30/24 1156 01/30/24 1600  BP: (!) 141/75 (!) 154/86  Pulse: 74 98  Resp: 16 16  Temp: 36.9 C 36.7 C  SpO2: 97% 99%    Last Pain:  Vitals:   01/30/24 1600  TempSrc: Oral  PainSc:                  Thom JONELLE Peoples

## 2024-01-30 NOTE — Progress Notes (Signed)
Discharge instructions and prescriptions given to pt. Discussed post-op care, signs and symptoms to report to the MD, upcoming appointments, and meds. Pt verbalizes understanding and has no questions or concerns at this time. Pt discharged home from hospital in stable condition.

## 2024-01-30 NOTE — Discharge Summary (Signed)
 Physician Discharge Summary  Patient ID: Bridget Summers MRN: [id number] DOB/AGE: 38/01/1986 38 y.o.  Admit date: 01/27/2024 Discharge date: 01/30/2024  Admission Diagnoses: Incomplete abortion, retianed products of conception  Discharge Diagnoses:  Principal Problem:   Incomplete abortion   Discharged Condition: good  Hospital Course: Admitted with fever, abd pain, and vaginal bleeding. Bleeding started after taking an abortion pill mid-August and has continue x 4 weeks. Moderate improvement in symptoms w/ IV antibiotics, bleeding and pain continued and pt remained febrile on antibiotics. Management options reviewed and pt elects D&E. Procedure completed on 01/30/24, hemodynamically stable after procedure, afebrile overnight, and pain well-managed w/ PO acetaminophen . Elevated BP during admission, hypertension managed w/ PO nifedipine  30 mg daily. Pt to continue antihypertensive and follow-up in 1 week.   Consults: None  Significant Diagnostic Studies: labs:  Treatments: IV hydration, antibiotics: doxycycline  and flagyl  , and procedures: dilation and evacuation  Discharge Exam: Blood pressure (!) 154/86, pulse 98, temperature 98.1 F (36.7 C), temperature source Oral, resp. rate 16, height 5' 5 (1.651 m), weight 111.1 kg, SpO2 99%, unknown if currently breastfeeding. General appearance: alert, cooperative, and no distress Resp: unlabored Cardio: neg edema Extremities: no edema, redness or tenderness in the calves or thighs  Disposition: Discharge disposition: 01-Home or Self Care       Discharge Instructions     Discharge activity:  No Restrictions   Complete by: As directed    Discharge diet:  No restrictions   Complete by: As directed    Discharge instructions   Complete by: As directed    See handouts   Sexual Activity:     Complete by: As directed    Nothing in the vagina including tampons, menstrual cups, douching, and intercourse until your 4 week appointment.       Allergies as of 01/30/2024   No Known Allergies      Medication List     STOP taking these medications    gabapentin  100 MG capsule Commonly known as: NEURONTIN    iron  polysaccharides 150 MG capsule Commonly known as: NIFEREX       TAKE these medications    acetaminophen  500 MG tablet Commonly known as: TYLENOL  Take 2 tablets (1,000 mg total) by mouth every 6 (six) hours.   cephALEXin  500 MG capsule Commonly known as: KEFLEX  Take 1 capsule (500 mg total) by mouth 2 (two) times daily.   DHA OMEGA 3 PO Take 1 capsule by mouth daily.   etonogestrel -ethinyl estradiol  0.12-0.015 MG/24HR vaginal ring Commonly known as: NUVARING Place 1 each vaginally every 21 ( twenty-one) days. Insert one (1) ring vaginally and leave in place for three (3) weeks, then remove for one (1) week.   ibuprofen  600 MG tablet Commonly known as: ADVIL  Take 1 tablet (600 mg total) by mouth every 6 (six) hours. What changed: when to take this   NIFEdipine  30 MG 24 hr tablet Commonly known as: ADALAT  CC Take 1 tablet (30 mg total) by mouth daily. Start taking on: January 31, 2024   OREGANO OIL IMMUNE SUPPORT PO Take 1 capsule by mouth daily.   prenatal multivitamin Tabs tablet Take 1 tablet by mouth daily at 12 noon.        Follow-up Information     Summers, Naima, MD. Schedule an appointment as soon as possible for a visit in 4 week(s).   Specialty: Obstetrics and Gynecology Why: As needed Contact information: [address] [place] [address] [phone]  Signed: Merrillyn Ackerley B [name] 01/30/2024, 4:32 PM

## 2024-02-01 LAB — CULTURE, BLOOD (ROUTINE X 2)
Culture: NO GROWTH
Culture: NO GROWTH
Special Requests: ADEQUATE

## 2024-02-02 ENCOUNTER — Other Ambulatory Visit (HOSPITAL_COMMUNITY): Payer: Self-pay | Admitting: Obstetrics and Gynecology

## 2024-02-02 DIAGNOSIS — O034 Incomplete spontaneous abortion without complication: Secondary | ICD-10-CM

## 2024-02-02 LAB — SURGICAL PATHOLOGY
